# Patient Record
Sex: Female | Born: 1945 | Race: White | Hispanic: No | Marital: Married | Smoking: Never smoker
Health system: Western US, Academic
[De-identification: ages and names within clinical notes are randomized; demographics above are authoritative.]

## PROBLEM LIST (undated history)

## (undated) ENCOUNTER — Ambulatory Visit: Payer: Self-pay | Admitting: Orthopaedic Surgery

## (undated) ENCOUNTER — Encounter: Admission: RE | Payer: Self-pay

## (undated) ENCOUNTER — Ambulatory Visit: Payer: Self-pay | Admitting: Interventional Radiology

## (undated) ENCOUNTER — Ambulatory Visit: Admission: RE | Admitting: Ophthalmology

## (undated) ENCOUNTER — Ambulatory Visit: Admission: RE | Payer: Medicare Other | Admitting: Ophthalmology

## (undated) DIAGNOSIS — M069 Rheumatoid arthritis, unspecified: Secondary | ICD-10-CM

## (undated) DIAGNOSIS — M86659 Other chronic osteomyelitis, unspecified thigh: Secondary | ICD-10-CM

## (undated) DIAGNOSIS — E039 Hypothyroidism, unspecified: Secondary | ICD-10-CM

## (undated) DIAGNOSIS — H269 Unspecified cataract: Secondary | ICD-10-CM

## (undated) DIAGNOSIS — H04129 Dry eye syndrome of unspecified lacrimal gland: Secondary | ICD-10-CM

## (undated) HISTORY — DX: Other chronic osteomyelitis, unspecified thigh (CMS-HCC): M86.659

## (undated) HISTORY — DX: Unspecified cataract: H26.9

## (undated) HISTORY — DX: Hypothyroidism, unspecified: E03.9

## (undated) HISTORY — DX: Rheumatoid arthritis, unspecified (CMS-HCC): M06.9

## (undated) HISTORY — DX: Dry eye syndrome of unspecified lacrimal gland: H04.129

## (undated) SURGERY — EXTRACTION, CATARACT, EXTRACAPSULAR, WITH IOL INSERTION
Anesthesia: Monitored Anesthesia Care (MAC) | Site: Eye | Laterality: Left

## (undated) SURGERY — PHACOEMULSIFICATION, CATARACT
Anesthesia: Monitored Anesthesia Care (MAC) | Site: Eye | Laterality: Right

## (undated) SURGERY — EXCISION, MASS, HAND
Anesthesia: Local | Laterality: Right

## (undated) MED ORDER — PHENYLEPHRINE HCL 2.5 % OP SOLN
1.0000 [drp] | OPHTHALMIC | Status: AC
Start: 2020-08-03 — End: 2020-08-03

## (undated) MED ORDER — PHENYLEPHRINE HCL 2.5 % OP SOLN
1.0000 [drp] | OPHTHALMIC | Status: AC
Start: 2019-08-15 — End: 2019-08-15

## (undated) MED ORDER — MOXIFLOXACIN HCL 0.5 % OP SOLN
1.0000 [drp] | Freq: Four times a day (QID) | OPHTHALMIC | 0 refills | Status: AC
Start: 2020-08-02 — End: ?

## (undated) MED ORDER — PREDNISOLONE ACETATE 1 % OP SUSP
1.0000 [drp] | Freq: Once | OPHTHALMIC | Status: AC
Start: 2019-08-15 — End: 2019-08-15

## (undated) MED ORDER — TOBRAMYCIN SULFATE 0.3 % OP SOLN
1.0000 [drp] | OPHTHALMIC | Status: AC
Start: 2019-08-15 — End: 2019-08-15

## (undated) MED ORDER — METHOCARBAMOL 750 MG OR TABS
750.0000 mg | ORAL_TABLET | Freq: Four times a day (QID) | ORAL | 0 refills | Status: AC
Start: 2023-01-14 — End: ?

## (undated) MED ORDER — TETRACAINE HCL 0.5% VISCOUS OP SOLN (~~LOC~~)
1.0000 [drp] | Freq: Once | OPHTHALMIC | Status: AC
Start: 2019-08-15 — End: 2019-08-15

## (undated) MED ORDER — TOBRAMYCIN SULFATE 0.3 % OP SOLN
1.0000 [drp] | OPHTHALMIC | Status: AC
Start: 2020-08-03 — End: 2020-08-03

## (undated) MED ORDER — OXYCODONE HCL 5 MG OR CAPS
5.0000 mg | ORAL_CAPSULE | Freq: Four times a day (QID) | ORAL | 0 refills | Status: AC | PRN
Start: 2022-03-17 — End: ?

## (undated) MED ORDER — PREDNISOLONE ACETATE 1 % OP SUSP
1.0000 [drp] | Freq: Once | OPHTHALMIC | Status: AC
Start: 2020-08-03 — End: 2020-08-03

## (undated) MED ORDER — IPRATROPIUM-ALBUTEROL 0.5-2.5 (3) MG/3ML IN SOLN
3.0000 mL | Freq: Once | RESPIRATORY_TRACT | Status: AC
Start: 2022-03-20 — End: 2022-03-21

## (undated) MED ORDER — BROMFENAC SODIUM 0.07 % OP SOLN
1.0000 [drp] | Freq: Two times a day (BID) | OPHTHALMIC | 0 refills | Status: AC
Start: 2020-08-02 — End: ?

## (undated) MED ORDER — TETRACAINE HCL 0.5 % OP SOLN
1.0000 [drp] | OPHTHALMIC | Status: AC
Start: 2019-08-15 — End: 2019-08-15

## (undated) MED ORDER — TETRACAINE HCL 0.5 % OP SOLN
1.0000 [drp] | OPHTHALMIC | Status: AC
Start: 2020-08-03 — End: 2020-08-03

## (undated) MED ORDER — METHOCARBAMOL 750 MG OR TABS
750.0000 mg | ORAL_TABLET | Freq: Four times a day (QID) | ORAL | 0 refills | Status: AC
Start: 2022-10-19 — End: ?

## (undated) MED ORDER — TROPICAMIDE 1 % OP SOLN
1.0000 [drp] | OPHTHALMIC | Status: AC
Start: 2020-08-03 — End: 2020-08-03

## (undated) MED ORDER — TROPICAMIDE 1 % OP SOLN
1.0000 [drp] | OPHTHALMIC | Status: AC
Start: 2019-08-15 — End: 2019-08-15

## (undated) MED ORDER — TETRACAINE HCL 0.5 % OP SOLN
1.00 [drp] | OPHTHALMIC | Status: AC
Start: 2019-08-15 — End: 2019-08-15

## (undated) MED ORDER — KETOROLAC TROMETHAMINE 0.5 % OP SOLN
1.0000 [drp] | Freq: Four times a day (QID) | OPHTHALMIC | 0 refills | Status: AC
Start: 2020-08-02 — End: ?

## (undated) MED ORDER — OFLOXACIN 0.3 % OP SOLN
1.0000 [drp] | Freq: Four times a day (QID) | OPHTHALMIC | 0 refills | Status: AC
Start: 2020-08-02 — End: ?

## (undated) MED ORDER — METHYLPREDNISOLONE 4 MG OR TABS
4.0000 mg | ORAL_TABLET | Freq: Every day | ORAL | 0 refills | Status: AC
Start: 2022-04-03 — End: 2022-04-08

## (undated) MED ORDER — TROPICAMIDE 1 % OP SOLN
1.00 [drp] | OPHTHALMIC | Status: AC
Start: 2019-08-15 — End: 2019-08-15

## (undated) MED ORDER — TETRACAINE HCL 0.5% VISCOUS OP SOLN (~~LOC~~)
1.00 [drp] | Freq: Once | OPHTHALMIC | Status: AC
Start: 2019-08-15 — End: 2019-08-15

## (undated) MED ORDER — TOBRAMYCIN SULFATE 0.3 % OP SOLN
1.00 [drp] | OPHTHALMIC | Status: AC
Start: 2019-08-15 — End: 2019-08-15

## (undated) MED ORDER — PHENYLEPHRINE HCL 2.5 % OP SOLN
1.00 [drp] | OPHTHALMIC | Status: AC
Start: 2020-08-03 — End: 2020-08-03

## (undated) MED ORDER — TROPICAMIDE 1 % OP SOLN
1.00 [drp] | OPHTHALMIC | Status: AC
Start: 2020-08-03 — End: 2020-08-03

## (undated) MED ORDER — PREDNISOLONE ACETATE 1 % OP SUSP
1.00 [drp] | Freq: Once | OPHTHALMIC | Status: AC
Start: 2020-08-03 — End: 2020-08-03

## (undated) MED ORDER — PREDNISOLONE ACETATE 1 % OP SUSP
1.00 [drp] | Freq: Once | OPHTHALMIC | Status: AC
Start: 2019-08-15 — End: 2019-08-15

## (undated) MED ORDER — TETRACAINE HCL 0.5 % OP SOLN
1.00 [drp] | OPHTHALMIC | Status: AC
Start: 2020-08-03 — End: 2020-08-03

## (undated) MED ORDER — TOBRAMYCIN SULFATE 0.3 % OP SOLN
1.00 [drp] | OPHTHALMIC | Status: AC
Start: 2020-08-03 — End: 2020-08-03

## (undated) MED ORDER — BROMFENAC SODIUM 0.07 % OP SOLN
1.00 [drp] | Freq: Two times a day (BID) | OPHTHALMIC | 0 refills | Status: AC
Start: 2020-08-02 — End: ?

## (undated) MED ORDER — OFLOXACIN 0.3 % OP SOLN
1.00 [drp] | Freq: Four times a day (QID) | OPHTHALMIC | 0 refills | Status: AC
Start: 2020-08-02 — End: ?

## (undated) MED ORDER — PHENYLEPHRINE HCL 2.5 % OP SOLN
1.00 [drp] | OPHTHALMIC | Status: AC
Start: 2019-08-15 — End: 2019-08-15

## (undated) MED ORDER — MOXIFLOXACIN HCL 0.5 % OP SOLN
1.00 [drp] | Freq: Four times a day (QID) | OPHTHALMIC | 0 refills | Status: AC
Start: 2020-08-02 — End: ?

## (undated) MED ORDER — KETOROLAC TROMETHAMINE 0.5 % OP SOLN
1.00 [drp] | Freq: Four times a day (QID) | OPHTHALMIC | 0 refills | Status: AC
Start: 2020-08-02 — End: ?

---

## 2017-09-14 HISTORY — PX: BONE RESECTION: SHX897

## 2019-08-15 ENCOUNTER — Ambulatory Visit: Attending: Optometrist | Admitting: Ophthalmology

## 2019-08-15 ENCOUNTER — Encounter: Payer: Self-pay | Admitting: Ophthalmology

## 2019-08-15 ENCOUNTER — Ambulatory Visit: Payer: Medicare Other | Attending: Optometrist | Admitting: Ophthalmology

## 2019-08-15 DIAGNOSIS — H02889 Meibomian gland dysfunction of unspecified eye, unspecified eyelid: Secondary | ICD-10-CM | POA: Insufficient documentation

## 2019-08-15 DIAGNOSIS — M3501 Sicca syndrome with keratoconjunctivitis: Secondary | ICD-10-CM | POA: Insufficient documentation

## 2019-08-15 DIAGNOSIS — H269 Unspecified cataract: Secondary | ICD-10-CM | POA: Insufficient documentation

## 2019-08-15 DIAGNOSIS — H2513 Age-related nuclear cataract, bilateral: Secondary | ICD-10-CM

## 2019-08-15 MED ORDER — METHOTREXATE SODIUM 250 MG/10ML IJ SOLN
0.8000 mL | INTRAMUSCULAR | Status: DC
Start: 2019-07-13 — End: 2020-07-23

## 2019-08-15 MED ORDER — AZELASTINE HCL 137 MCG/SPRAY NA SOLN
1.0000 | NASAL | Status: DC
Start: ? — End: 2020-07-23

## 2019-08-15 MED ORDER — TOBRAMYCIN SULFATE 0.3 % OP SOLN
1.0000 [drp] | Freq: Four times a day (QID) | OPHTHALMIC | 0 refills | Status: DC
Start: 2019-08-15 — End: 2022-03-19

## 2019-08-15 MED ORDER — THERATEARS 0.25 % OP SOLN: 1.0000 [drp] | Freq: Every day | OPHTHALMIC | Status: AC

## 2019-08-15 MED ORDER — FLUOROMETHOLONE 0.1 % OP SUSP
1.0000 [drp] | Freq: Four times a day (QID) | OPHTHALMIC | 0 refills | Status: DC
Start: 2019-08-15 — End: 2019-08-17

## 2019-08-15 MED ORDER — OLOPATADINE HCL 0.7 % OP SOLN
1.0000 [drp] | Freq: Every day | OPHTHALMIC | Status: DC
Start: 2019-05-08 — End: 2022-03-19

## 2019-08-15 MED ORDER — FLUTICASONE PROPIONATE 50 MCG/ACT NA SUSP
1.0000 | NASAL | Status: DC
Start: ? — End: 2022-03-19

## 2019-08-15 MED ORDER — SYNTHROID 88 MCG OR TABS
1.0000 | ORAL_TABLET | Freq: Every day | ORAL | Status: DC
Start: 2019-06-27 — End: 2022-03-19

## 2019-08-15 MED ORDER — FOLIC ACID 1 MG OR TABS
1.0000 mg | ORAL_TABLET | ORAL | Status: AC
Start: 2019-05-10 — End: ?

## 2019-08-15 MED ORDER — SERTRALINE HCL 100 MG OR TABS
150.0000 mg | ORAL_TABLET | Freq: Every day | ORAL | Status: DC
Start: 2019-06-12 — End: 2022-03-19

## 2019-08-15 MED ORDER — OLOPATADINE HCL 0.7 % OP SOLN
1.00 [drp] | Freq: Every day | OPHTHALMIC | Status: AC
Start: 2019-05-08 — End: ?

## 2019-08-15 MED ORDER — FLUOROMETHOLONE 0.1 % OP SUSP
1.00 [drp] | Freq: Four times a day (QID) | OPHTHALMIC | 0 refills | Status: DC
Start: 2019-08-15 — End: 2019-08-17

## 2019-08-15 MED ORDER — THERATEARS 0.25 % OP SOLN: 1.00 [drp] | Freq: Every day | OPHTHALMIC | Status: AC

## 2019-08-15 MED ORDER — METHOTREXATE SODIUM 250 MG/10ML IJ SOLN
0.80 mL | INTRAMUSCULAR | Status: DC
Start: 2019-07-13 — End: 2020-07-23

## 2019-08-15 MED ORDER — SERTRALINE HCL 100 MG OR TABS
150.00 mg | ORAL_TABLET | Freq: Every day | ORAL | Status: AC
Start: 2019-06-12 — End: ?

## 2019-08-15 MED ORDER — SYNTHROID 88 MCG OR TABS
1.00 | ORAL_TABLET | Freq: Every day | ORAL | Status: AC
Start: 2019-06-27 — End: ?

## 2019-08-15 MED ORDER — FOLIC ACID 1 MG OR TABS
1.00 mg | ORAL_TABLET | ORAL | Status: AC
Start: 2019-05-10 — End: ?

## 2019-08-15 MED ORDER — FLUTICASONE PROPIONATE 50 MCG/ACT NA SUSP: 1.00 | NASAL | Status: AC

## 2019-08-15 MED ORDER — AZELASTINE HCL 137 MCG/SPRAY NA SOLN
1.00 | NASAL | Status: DC
Start: ? — End: 2020-07-23

## 2019-08-15 MED ORDER — TOBRAMYCIN SULFATE 0.3 % OP SOLN
1.00 [drp] | Freq: Four times a day (QID) | OPHTHALMIC | 0 refills | Status: AC
Start: 2019-08-15 — End: ?

## 2019-08-15 NOTE — Progress Notes (Signed)
New patient referred by Dr. Ranae Pila   First seen 08/15/2019     Nuclear sclerosis OU   Borderline visually significant cataracts OU--> also with significant dry eyes, discussed that combination of both things are effecting the vision     Visually bothered by: glare night driving, difficulty with near vision, does needle point and is having trouble     Visual goal: distance and wear OTC reading glasses     Factors:   History of hyperopic LASIK OU  Dry eyes in setting of RA     Plan:   Optimize surface and recheck measurements, likely plan is monofocal distance with catalys OD, monofocal distance +/- catalys OS   + ORA OU given history of LASIK     **multiple allergies will likely need to send alternative drops (tobramycin and hold acular, PF ok )    08/15/2019     IOLm TK   OD: 0.86 @ 178  OS: 0.36 @ 133    IOLm K   OD: 0.56 @ 180  OS: 0.60 @ 120    topo  OD:0.79 @ 008  OS: 0.33 @ 025     TCRP:   OD: 1.0 @ 008  OS: 0.4 @ 178    Dry eye OU   OD>OS  Was given Shirley Friar but finds that it burns a lot and is hard to tolerate  Would restart Xiidra BID  Also start FML taper 4-2-1- will RX   PFAT Q 1-2 hours   WC/LH     RTC 2-3 weeks for repeat measurements and repeat glare testing. sooner PRN     Incident to Additional Comments by Signing Physician  I reviewed and confirmed all history components, including the HPI; and any other elements performed by the technician.     Adriana Reams MD  Associate Professor of Ophthalmology  Cornea, Cataract, and Refractive Surgery

## 2019-08-16 ENCOUNTER — Telehealth: Payer: Self-pay | Admitting: Ophthalmology

## 2019-08-16 NOTE — Telephone Encounter (Signed)
FML is backorder. Please send an alternative. Per pharmacy, Prednisolone 1! Is available.

## 2019-08-16 NOTE — Telephone Encounter (Signed)
Per patient she still can not pick up her medications please assist ASAP   She need medications before her surgery

## 2019-08-17 ENCOUNTER — Telehealth: Payer: Self-pay | Admitting: Ophthalmology

## 2019-08-17 MED ORDER — FLUOROMETHOLONE 0.1 % OP SUSP
1.0000 [drp] | Freq: Four times a day (QID) | OPHTHALMIC | 0 refills | Status: AC
Start: 2019-08-17 — End: ?

## 2019-08-17 MED ORDER — FLUOROMETHOLONE 0.1 % OP SUSP
1.00 [drp] | Freq: Four times a day (QID) | OPHTHALMIC | 0 refills | Status: AC
Start: 2019-08-17 — End: ?

## 2019-08-17 NOTE — Telephone Encounter (Signed)
Pt.states she still waiting for the steroid drops and she wants to confirm which eye drops she needs to use before surgery. Told her that per pharmacy, [facility] is on backorder. Pt.requested to re-send the FML or the alternative to [facility] that is on file.   Also pt.needs the pre-op forms sent to her PCP Dr. Reather Laurence, address [address], Fax [phone]

## 2019-08-17 NOTE — Telephone Encounter (Signed)
Shelly Neal with Doctor Val[facility] office states have questions regarding future surgery, Shelly Neal forwarded to surgery scheduler Briget to assist. Thank you.

## 2019-08-17 NOTE — Telephone Encounter (Signed)
Tori Milks, MD  You 4 minutes ago (9:17 AM)     Medication sent to pharmacy listed      Called pt.to let her know that the medication was sent.

## 2019-08-21 ENCOUNTER — Telehealth: Payer: Self-pay | Admitting: Ophthalmology

## 2019-08-21 NOTE — Telephone Encounter (Signed)
Patient was connected to [facility] for her pre op forms

## 2019-08-24 ENCOUNTER — Telehealth: Payer: Self-pay | Admitting: Ophthalmology

## 2019-08-24 NOTE — Telephone Encounter (Signed)
Rine from patient doctor office called and request to get another copy of H & P short form, medical clearance that did not have EKG and lab work required fax over. Patient is having Cataract surgery.

## 2019-08-24 NOTE — Telephone Encounter (Signed)
In need to know if her arthritis medication will affect surgery

## 2019-08-24 NOTE — Telephone Encounter (Signed)
srina called from dr. Reather Laurence office they have new protocol for the office. Would like to speak to surgery scheduler  Please call her back [phone] option 2. Thanks!

## 2019-08-24 NOTE — Telephone Encounter (Signed)
Patient is requesting pre op lab order to be faxed to [place] [facility]  [address] [place], [place] [address]  Phone [phone]  Fax?

## 2019-08-25 ENCOUNTER — Telehealth: Payer: Self-pay | Admitting: Ophthalmology

## 2019-08-25 NOTE — Telephone Encounter (Signed)
Rine(MA)  called to request another H & P form from Dr. Reather Laurence  office.     Different number   Office: [phone]   Fax: [phone]

## 2019-08-28 ENCOUNTER — Other Ambulatory Visit: Payer: Self-pay

## 2019-08-28 MED ORDER — TOBRAMYCIN SULFATE 0.3 % OP SOLN
OPHTHALMIC | 0 refills | Status: DC
Start: 2019-08-28 — End: 2022-03-19
  Filled 2019-08-28: qty 5, 1d supply, fill #0

## 2019-08-28 MED ORDER — PREDNISOLONE ACETATE 1 % OP SUSP
OPHTHALMIC | 0 refills | Status: DC
Start: 2019-08-28 — End: 2020-10-18
  Filled 2019-08-28: qty 10, 1d supply, fill #0

## 2019-08-28 MED ORDER — ANTERIOR BLUE EYE KIT (WAM) (~~LOC~~)
PACK | 0 refills | Status: AC
Start: 2019-08-28 — End: ?
  Filled 2019-08-28: qty 1, 1d supply, fill #0

## 2019-08-28 MED ORDER — PREDNISOLONE ACETATE 1 % OP SUSP
OPHTHALMIC | 0 refills | Status: DC
Start: 2019-08-28 — End: 2020-10-18

## 2019-08-28 MED ORDER — ANTERIOR BLUE EYE KIT (WAM) (~~LOC~~)
PACK | 0 refills | Status: AC
Start: 2019-08-28 — End: ?

## 2019-08-28 MED ORDER — TOBRAMYCIN SULFATE 0.3 % OP SOLN
OPHTHALMIC | 0 refills | Status: AC
Start: 2019-08-28 — End: ?

## 2019-08-28 NOTE — Anesthesia Preprocedure Evaluation (Signed)
ANESTHESIA PRE-OPERATIVE EVALUATION    Patient Information    Name: Shelly Neal    MRN: [id number]    DOB: Dec 10, 1945    Age: 73 year old    Sex: female  Procedure(s):  P3 CATARACT EXTRACTION WITH INTRAOCULAR LENS IMPLANT RIGHT EYE      Pre-op Vitals:   There were no vitals taken for this visit.        Primary language spoken:  English    ROS/Medical History:               General:  Exercise YES-Walking daily 1 mile  Cardiovascular:     Anesthesia History:  no history of anesthetic complications,  no family history of anesthetic complications,   Pulmonary:   asthma,  sleep apnea (Snoring ),     Neuro/Psych:    Hematology/Oncology:       GI/Hepatic:   Infectious Disease:     Renal:   Endocrine/Other:  steriod use (Yes-Hypothyroid ),  arthritis (Rheumatoid ),      Pregnancy History:   Pediatrics:         Pre Anesthesia Testing (PCC/CPC) notes/comments:    [facility] Test & records reviewed by [facility] Provider.                                                   Past Medical History:   Diagnosis Date   . Cataract    . Dry eye    . Hypothyroidism    . RA (rheumatoid arthritis) (CMS-HCC)      No past surgical history on file.  Social History     Tobacco Use   . Smoking status: Not on file   Substance Use Topics   . Alcohol use: Not on file   . Drug use: Not on file       No current facility-administered medications for this encounter.      Current Outpatient Medications   Medication Sig Dispense Refill   . azelastine (ASTELIN) 0.1 % nasal spray 1 spray by Nasal route.     . Carboxymethylcellulose Sodium (THERATEARS) 0.25 % SOLN Place 1 drop into both eyes daily.     . fluorometholone (FML) 0.1 % ophthalmic suspension Place 1 drop into both eyes 4 times daily. 1 bottle 0   . fluticasone propionate (FLONASE) 50 MCG/ACT nasal spray 1 spray by Nasal route.     . folic acid (FOLVITE) 1 MG tablet 1 mg by Oral route.     . methotrexate (RHEUMATREX) 250 MG/10ML SOLN injection 0.8 mL once a week.     . Olopatadine HCl 0.7 % SOLN Place 1 drop  into both eyes daily.     . sertraline (ZOLOFT) 100 MG tablet Take 150 mg by mouth daily.     Marland Kitchen SYNTHROID 88 MCG tablet Take 1 tablet by mouth daily.     Marland Kitchen tobramycin (TOBREX) 0.3 % ophthalmic solution Place 1 drop into right eye 4 times daily. Start 3 days prior to surgery 1 bottle 0     Allergies   Allergen Reactions   . Cefaclor Hives     Other reaction(s): Pruritic rash (disorder)  Other reaction(s): Pruritic rash (disorder)  No problems with amoxicillin/clavulanate - last taken October 2019  Other reaction(s): Pruritic rash (disorder)  Other reaction(s): Pruritic rash (disorder)  Other reaction(s): Pruritic rash (disorder)     .  Ciprofloxacin Rash   . Hydromorphone Rash     pruritic rash to left arm - no shortness of breath  pruritic rash to left arm - no shortness of breath  pruritic rash to left arm - no shortness of breath  pruritic rash to left arm - no shortness of breath  pruritic rash to left arm - no shortness of breath     . Ibuprofen Hives and Rash     Aleve     . Meloxicam Rash   . Naproxen Rash   . Polyethylene Glycol Hives     Other reaction(s): Pruritic rash (disorder)  Other reaction(s): Pruritic rash (disorder)  Other reaction(s): Pruritic rash (disorder)  Other reaction(s): Pruritic rash (disorder)     . Sulfamethoxazole W-Trimethoprim Hives     Other reaction(s): Pruritic rash (disorder)  Other reaction(s): Pruritic rash (disorder)     . Gabapentin Nausea and Vomiting       Labs and Other Data  No results found for: NA, SODIUM, K, CL, BICARB, BUN, CREAT, GLU, Baxter  No results found for: AST, ALT, GGT, LDH, ALK, TP, ALB, TBILI, DBILI  No results found for: WBC, RBC, HGB, HCT, MCV, MCHC, RDW, PLT, PLCTEL, MPV, MPVH, SEG, LYMPHS, MONOS, EOS, BASOS  No results found for: INR, PTT  No results found for: ARTPH, ARTPO2, ARTPCO2    Anesthesia Plan

## 2019-08-29 ENCOUNTER — Ambulatory Visit: Attending: Optometrist | Admitting: Ophthalmology

## 2019-08-29 ENCOUNTER — Encounter: Payer: Self-pay | Admitting: Ophthalmology

## 2019-08-29 ENCOUNTER — Ambulatory Visit: Payer: Medicare Other | Attending: Optometrist | Admitting: Ophthalmology

## 2019-08-29 DIAGNOSIS — M3501 Sicca syndrome with keratoconjunctivitis: Secondary | ICD-10-CM | POA: Insufficient documentation

## 2019-08-29 DIAGNOSIS — H02889 Meibomian gland dysfunction of unspecified eye, unspecified eyelid: Secondary | ICD-10-CM

## 2019-08-29 DIAGNOSIS — H2513 Age-related nuclear cataract, bilateral: Secondary | ICD-10-CM | POA: Insufficient documentation

## 2019-08-29 NOTE — Progress Notes (Signed)
New patient referred by Dr. Ranae Pila   First seen 08/15/2019     Dr. 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 RA     Plan:   Optimize surface and recheck measurements, likely plan is monofocal distance with catalys OD, monofocal distance +/- catalys OS   + ORA OU given history of LASIK     **multiple allergies will likely need to send alternative drops (tobramycin and hold acular, PF ok )    08/15/2019     IOLm TK   OD: 0.86 @ 178  OS: 0.36 @ 133    IOLm K   OD: 0.56 @ 180  OS: 0.60 @ 120    topo  OD:0.79 @ 008  OS: 0.33 @ 025     TCRP:   OD: 1.0 @ 008  OS: 0.4 @ 178    08/29/2019 : here for repeat measurements  IOLm TK   OD: 0.71 @ 176  OS: 0.30@ 028    IOLm K   OD: 0.43 @ 180  OS: 0.19 @ 074    topo  OD: 0.85 @ 009  OS: 0.43 @ 006     TCRP:   OD: 1.2 @ 008  OS: 0.6 @ 179     Dry eye OU   OD>OS  Was given Shirley Friar but finds that it burns a lot and is hard to tolerate  Would restart Xiidra BID  On FML QID--> IOP increased, did not taper, taper BID x 4 days, then Q day x4 days then stop   PFAT Q 1-2 hours   WC/LH     Inflammation systemically is very high from RA right now--> would like patient to see Rhuem and get inflammation controlled x 3 months prior to surgery, RTC 2 months to reassess     Incident to Additional Comments by Signing Physician  I reviewed and confirmed all history components, including the HPI; and any other elements performed by the technician.     Adriana Reams MD  Associate Professor of Ophthalmology  Cornea, Cataract, and Refractive Surgery

## 2019-09-01 ENCOUNTER — Ambulatory Visit

## 2019-09-01 ENCOUNTER — Ambulatory Visit: Payer: Medicare Other

## 2019-09-04 DIAGNOSIS — H2513 Age-related nuclear cataract, bilateral: Secondary | ICD-10-CM

## 2019-09-05 ENCOUNTER — Ambulatory Visit: Admitting: Ophthalmology

## 2019-09-05 ENCOUNTER — Ambulatory Visit: Payer: Medicare Other | Admitting: Ophthalmology

## 2019-09-17 ENCOUNTER — Ambulatory Visit

## 2019-09-17 ENCOUNTER — Ambulatory Visit: Payer: Medicare Other

## 2019-09-18 ENCOUNTER — Other Ambulatory Visit: Payer: Self-pay

## 2019-09-18 DIAGNOSIS — H2513 Age-related nuclear cataract, bilateral: Secondary | ICD-10-CM | POA: Insufficient documentation

## 2019-09-19 ENCOUNTER — Ambulatory Visit: Admitting: Ophthalmology

## 2019-09-19 ENCOUNTER — Ambulatory Visit: Payer: Medicare Other | Admitting: Ophthalmology

## 2019-09-19 NOTE — Progress Notes (Deleted)
New patient referred by Dr. Ranae Pila   First seen 08/15/2019     Dr. 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     Plan:   Optimize surface and recheck measurements, likely plan is monofocal distance with catalys OD, monofocal distance +/- catalys OS   + ORA OU given history of LASIK     **multiple allergies will likely need to send alternative drops (tobramycin and hold acular, PF ok )    08/15/2019     IOLm TK   OD: 0.86 @ 178  OS: 0.36 @ 133    IOLm K   OD: 0.56 @ 180  OS: 0.60 @ 120    topo  OD:0.79 @ 008  OS: 0.33 @ 025     TCRP:   OD: 1.0 @ 008  OS: 0.4 @ 178    08/29/2019 : here for repeat measurements  IOLm TK   OD: 0.71 @ 176  OS: 0.30@ 028    IOLm K   OD: 0.43 @ 180  OS: 0.19 @ 074    topo  OD: 0.85 @ 009  OS: 0.43 @ 006     TCRP:   OD: 1.2 @ 008  OS: 0.6 @ 179     09/19/2019: returning today to discuss CE/IOL again  Systemic inflammation under better control? ***    Dry eye OU   OD>OS  Was given Shirley Friar but finds that it burns a lot and is hard to tolerate  Would restart Xiidra BID  On FML QID--> IOP increased, did not taper, taper BID x 4 days, then Q day x4 days then stop   PFAT Q 1-2 hours   WC/LH     Inflammation systemically is very high from RA right now--> would like patient to see Rhuem and get inflammation controlled x 3 months prior to surgery, RTC 2 months to reassess

## 2019-10-09 ENCOUNTER — Ambulatory Visit: Payer: Self-pay

## 2019-10-12 ENCOUNTER — Ambulatory Visit: Payer: Self-pay

## 2019-11-10 ENCOUNTER — Ambulatory Visit: Admitting: Ophthalmology

## 2019-11-10 ENCOUNTER — Ambulatory Visit: Payer: Medicare Other | Admitting: Ophthalmology

## 2020-07-10 ENCOUNTER — Telehealth: Payer: Self-pay | Admitting: Ophthalmology

## 2020-07-10 NOTE — Telephone Encounter (Signed)
SCHEDULED

## 2020-07-10 NOTE — Telephone Encounter (Signed)
The pt needs to be seen sooner with Dr Alveta Heimlich, please follow up and assist the pt. Very urgent per pt to be seen sooner, per pt her cataract are worsening   thank you.

## 2020-07-23 ENCOUNTER — Encounter: Payer: Self-pay | Admitting: Orthopaedic Surgery

## 2020-07-23 ENCOUNTER — Ambulatory Visit: Attending: Orthopaedic Surgery | Admitting: Orthopaedic Surgery

## 2020-07-23 ENCOUNTER — Ambulatory Visit: Payer: Medicare Other | Attending: Orthopaedic Surgery | Admitting: Orthopaedic Surgery

## 2020-07-23 VITALS — BP 121/76 | HR 64 | Temp 96.8°F | Resp 17

## 2020-07-23 DIAGNOSIS — R2231 Localized swelling, mass and lump, right upper limb: Secondary | ICD-10-CM | POA: Insufficient documentation

## 2020-07-23 MED ORDER — FLUTICASONE-SALMETEROL 250-50 MCG/DOSE IN MISC
INHALATION_SPRAY | RESPIRATORY_TRACT | Status: DC
Start: 2019-10-24 — End: 2022-03-21

## 2020-07-23 MED ORDER — PREGABALIN 25 MG OR CAPS
ORAL_CAPSULE | ORAL | Status: AC
Start: 2020-06-12 — End: ?

## 2020-07-23 MED ORDER — TIOTROPIUM BROMIDE MONOHYDRATE 18 MCG IN CAPS
18.0000 ug | ORAL_CAPSULE | RESPIRATORY_TRACT | Status: DC
Start: ? — End: 2020-09-02

## 2020-07-23 MED ORDER — BUDESONIDE-FORMOTEROL FUMARATE 160-4.5 MCG/ACT IN AERO: 2.0000 | INHALATION_SPRAY | RESPIRATORY_TRACT | Status: AC | PRN

## 2020-07-23 MED ORDER — PREDNISONE 20 MG OR TABS
ORAL_TABLET | ORAL | Status: DC
Start: ? — End: 2020-09-02

## 2020-07-23 MED ORDER — AZELASTINE HCL 0.15 % NA SOLN
NASAL | Status: DC
Start: ? — End: 2022-03-19

## 2020-07-23 MED ORDER — TRAMADOL HCL 50 MG OR TABS
50.0000 mg | ORAL_TABLET | ORAL | Status: DC
Start: 2020-03-26 — End: 2020-09-02

## 2020-07-23 MED ORDER — XIIDRA 5 % OP SOLN
OPHTHALMIC | Status: DC
Start: 2020-07-01 — End: 2022-03-19

## 2020-07-23 MED ORDER — METHOTREXATE 2.5 MG OR TABS
ORAL_TABLET | ORAL | Status: DC
Start: 2019-05-30 — End: 2022-03-19

## 2020-07-23 MED ORDER — OYSTER SHELL CALCIUM 500 MG OR TABS
ORAL_TABLET | ORAL | Status: DC
Start: ? — End: 2022-03-21

## 2020-07-23 MED ORDER — TRAMADOL HCL 50 MG OR TABS
50.00 mg | ORAL_TABLET | ORAL | Status: DC
Start: 2020-03-26 — End: 2020-09-02

## 2020-07-23 MED ORDER — METHOTREXATE 2.5 MG OR TABS
ORAL_TABLET | ORAL | Status: AC
Start: 2019-05-30 — End: ?

## 2020-07-23 MED ORDER — PREGABALIN 25 MG OR CAPS
ORAL_CAPSULE | ORAL | Status: DC
Start: 2020-06-12 — End: 2020-09-02

## 2020-07-23 MED ORDER — FLUTICASONE-SALMETEROL 250-50 MCG/DOSE IN MISC
INHALATION_SPRAY | RESPIRATORY_TRACT | Status: AC
Start: 2019-10-24 — End: ?

## 2020-07-23 MED ORDER — AZELASTINE HCL 0.15 % NA SOLN: NASAL | Status: AC

## 2020-07-23 MED ORDER — TIOTROPIUM BROMIDE MONOHYDRATE 18 MCG IN CAPS
18.00 ug | ORAL_CAPSULE | RESPIRATORY_TRACT | Status: DC
Start: ? — End: 2020-09-02

## 2020-07-23 MED ORDER — XIIDRA 5 % OP SOLN
OPHTHALMIC | Status: AC
Start: 2020-07-01 — End: ?

## 2020-07-23 MED ORDER — BUDESONIDE-FORMOTEROL FUMARATE 160-4.5 MCG/ACT IN AERO: 2.00 | INHALATION_SPRAY | RESPIRATORY_TRACT | Status: AC

## 2020-07-23 MED ORDER — OYSTER SHELL CALCIUM 500 MG OR TABS: ORAL_TABLET | ORAL | Status: AC

## 2020-07-23 NOTE — Progress Notes (Signed)
Maurilio Lovely,  MD / [facility]  Assistant Professor [place], [place], and Shoulder Surgery [address]   Department of Orthopaedic Surgery [place], [place] [address]  [phone]  License # [id number]     [facility]   [address], [place]   [place], [place] [address]  [phone]             Initial Office Visit    DATE OF VISIT: 07/23/2020  PATIENT NAME: Shelly Neal  DOB: 03-31-1946    REFERRED BY: Johnanna Schneiders  PRIMARY CARE PHYSICIAN: Johnanna Schneiders    CHIEF COMPLAINT:  Right thumb mass    HISTORY OF PRESENT ILLNESS:  Shelly Neal is a 74 year old female right hand dominant who presents with right thumb mass. Patient complains of a small mass on the ulnar aspect of her hand for the past 2 months. She feels that mass is stable in size without any changes. She denies any inciting trauma and the mass appeared and has since then stayed the same. She denies any pain or discomfort. She denies any numbness/tingling sensation. She denies any fevers or night pain. She denies any radiating pain with palpation. 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     PAST MEDICAL HISTORY:   Past Medical History:   Diagnosis Date   . Cataract    . Dry eye    . Hypothyroidism    . RA (rheumatoid arthritis) (CMS-HCC)        PAST SURGICAL HISTORY: 17 surgeries. Most recently osteomyelitis pelvis   No past surgical history on file.    MEDICATIONS:     Current Outpatient Medications:   .  anterior blue eye kit, USED AS INSTRUCTED BY YOUR PHYSICIAN (KIT FOR RIGHT EYE), Disp: 1 kit, Rfl: 0  .  azelastine (ASTELIN) 0.1 % nasal spray, 1 spray by Nasal route., Disp: , Rfl:   .  Carboxymethylcellulose Sodium (THERATEARS) 0.25 % SOLN, Place 1 drop into both eyes daily., Disp: , Rfl:   .  fluorometholone (FML) 0.1 % ophthalmic suspension, Place 1 drop into both eyes 4 times daily., Disp: 1 bottle, Rfl: 0  .  fluticasone propionate (FLONASE) 50 MCG/ACT  nasal spray, 1 spray by Nasal route., Disp: , Rfl:   .  folic acid (FOLVITE) 1 MG tablet, 1 mg by Oral route., Disp: , Rfl:   .  methotrexate (RHEUMATREX) 250 MG/10ML SOLN injection, 0.8 mL once a week., Disp: , Rfl:   .  Olopatadine HCl 0.7 % SOLN, Place 1 drop into both eyes daily., Disp: , Rfl:   .  prednisoLONE acetate (PRED FORTE) 1 % ophthalmic suspension, Place 1 drop into right eye 4 times daily., Disp: 10 mL, Rfl: 0  .  sertraline (ZOLOFT) 100 MG tablet, Take 150 mg by mouth daily., Disp: , Rfl:   .  SYNTHROID 88 MCG tablet, Take 1 tablet by mouth daily., Disp: , Rfl:   .  tobramycin (TOBREX) 0.3 % ophthalmic solution, Place 1 drop into right eye 4 times daily., Disp: 5 mL, Rfl: 0  .  tobramycin (TOBREX) 0.3 % ophthalmic solution, Place 1 drop into right eye 4 times daily. Start 3 days prior to surgery, Disp: 1 bottle, Rfl: 0    ALLERGIES:   Cefaclor, Ciprofloxacin, Diagnostic x-ray materials, Hydromorphone, Ibuprofen, Meloxicam, Naproxen, Polyethylene glycol, Sulfamethoxazole w-trimethoprim, and Gabapentin    FAMILY HISTORY:  Reviewed and negative  No family history on file.    SOCIAL HISTORY:  Tobacco: none  Alcohol: none  Drugs: none  Occupation: retired Pharmacist, hospital   Social History     Occupational History   . Not on file   Tobacco Use   . Smoking status: Never Smoker   . Smokeless tobacco: Never Used   Substance and Sexual Activity   . Alcohol use: Never   . Drug use: Never   . Sexual activity: Not on file        REVIEW OF SYSTEMS:  Patient denies fevers, chills, chest pain, shortness of breath, and lymphedema. A detailed 12-point review of systems was provided on the patient intake form and reviewed by MD. All other systems reviewed were negative.     PHYSICAL EXAMINATION:  Vital Signs: BP 121/76 (BP Location: Right arm, BP Patient Position: Sitting, BP cuff size: Regular)   Pulse 64   Temp 96.8 F (36 C)   Resp 17   General: The patient is in no acute distress and well nourished for her  age.  Psychiatric: The patient is awake, alert, and oriented to person, place, time and situation. Good mental health with normal mood and affect.  HEENT: Normocephalic, atraumatic. Normal hearing.  Cardiovascular: Palpable upper extremity pulses.  Respiratory: No labored breathing or intercostal retractions.   Skin:  No open lesions, open ulcers, or healing wounds. No induration.  Lymphatic System:  There is no lymphedema or pitting edema in the axilla or upper extremity.  Neurological: The patient has normal motor and sensory function in the distribution of the axillary, median, radial, and ulnar nerves. No long tract signs.  Musculoskeletal:     Right upper extremity  Cystic and mobile mass  on the volar radial aspect of right thumb IPJ   5 x 5 mm   Mobile   No tinels  Not tender to palpation  Skin intact  + thumbs up/ok /2-3 cross/ fist/opposition  Sensation intact to light touch: median, ulnar, radial nerve distribution  Fingers warm, well perfused  2+ radial pulse     IMAGING:   I personally reviewed the following imaging with the patient at today's office visit:    Ultrasound:  Ultrasound performed by MD.  A hypoechoic mass was identified on the radial aspect of the thumb IP joint.  This was subcutaneous.  Mobile.  There was no enhancement of color signal.  Cyst does not appear to communicate with joint. Images saved to epic    ASSESSMENT:  1. 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  We reviewed our clinical examination as well as the ultrasound with the patient.  She has a small, mobile,  hypoechoic mass.  This does appear benign in nature.  This is bothersome for her and she is interested in having this removed.  Discussed that we can perform this under local anesthesia.  We discussed that we do not have a definitive diagnosis for the mass until it is sent to the laboratory for pathologic examination.  She  understands and has questions whether this is correlated with other cysts she has had or her rheumatoid arthritis.  Again we discussed that we do not know until the final laboratory examination.  Patient understands and wishes to proceed.  - Plan for right thumb excision of mass under local anesthesia.    The diagnosis  and treatment options were discussed and reviewed in detail with the patient, who communicates understanding with the plan.    I have personally reviewed and interpreted the patient's prior diagnostic testing, relevant imaging, and external notes from their other treating physicians in the process of formulating my assessment and plan of care for their current symptoms and pathology.    I have also personally discussed the patient's diagnostic testing, relevant imaging, and my assessment and plan with the patient's relevant external physicians and healthcare providers as needed.    Recommended and prescribed: mass excision    Return to clinic postop    MEDICAL DECISION MAKING:    Problems: moderate  Data: moderate  Category 1: 3 data points   [facility] Provider notes reviewed and Provider ultrasound ordered and reviewed     Category 2: Independent interpretation of ultrasound  Category 3: I have also personally discussed the patient's diagnostic testing, relevant imaging, and my assessment and plan with the patient's relevant external physicians and healthcare providers as needed.  Risk: moderate   Discussion regarding elective major surgery      ------    I have examined the patient and reviewed the key and critical portions of the history, physical exam, assessment, and plan as presented and documented by the resident. I agree with the medical decision making and the assessment and plan as documented. I have personally edited and revised the note as written. My additions and/or revisions are included in the medical record.     Ann Lions. Deatra Ina, MD  Assistant Clinical Professor  Department of Orthopaedic  Surgery

## 2020-07-23 NOTE — Patient Instructions (Signed)
Sx coordinator card information provided, will contact to proceed surgery.

## 2020-07-24 ENCOUNTER — Encounter: Payer: Self-pay | Admitting: Orthopaedic Surgery

## 2020-07-29 NOTE — Interdisciplinary (Signed)
07/29/2020  4:30 PM     Pre op call    Spoke with Shelly Neal who is scheduled for surgery on 08/01/20 with Dr. Deatra Ina. Reviewed with patient date, time, and location of surgery (address/floor/suite reviewed). Discussed that is ok to eat prior to surgery (no need to be fasting) and appropriate clothing. Transportation needs discussed, patient will not need a driver. Patient is not on any anti-coagulants. All questions answered. Appointment confirmed with patient. Patient requested information to be sent via email as well. Netta Neat, RN

## 2020-08-01 ENCOUNTER — Telehealth: Payer: Self-pay | Admitting: Orthopaedic Surgery

## 2020-08-01 DIAGNOSIS — R2231 Localized swelling, mass and lump, right upper limb: Secondary | ICD-10-CM

## 2020-08-01 NOTE — Telephone Encounter (Signed)
Called patient left voicemail with my call back number -su 08/01/2020

## 2020-08-02 ENCOUNTER — Encounter: Payer: Self-pay | Admitting: Ophthalmology

## 2020-08-02 ENCOUNTER — Ambulatory Visit: Attending: Ophthalmology | Admitting: Ophthalmology

## 2020-08-02 ENCOUNTER — Ambulatory Visit: Payer: Medicare Other | Attending: Ophthalmology | Admitting: Ophthalmology

## 2020-08-02 DIAGNOSIS — H269 Unspecified cataract: Secondary | ICD-10-CM

## 2020-08-02 DIAGNOSIS — H02883 Meibomian gland dysfunction of right eye, unspecified eyelid: Secondary | ICD-10-CM | POA: Insufficient documentation

## 2020-08-02 DIAGNOSIS — H02886 Meibomian gland dysfunction of left eye, unspecified eyelid: Secondary | ICD-10-CM | POA: Insufficient documentation

## 2020-08-02 DIAGNOSIS — H2513 Age-related nuclear cataract, bilateral: Secondary | ICD-10-CM

## 2020-08-02 DIAGNOSIS — H52209 Unspecified astigmatism, unspecified eye: Secondary | ICD-10-CM

## 2020-08-02 DIAGNOSIS — Z9889 Other specified postprocedural states: Secondary | ICD-10-CM | POA: Insufficient documentation

## 2020-08-02 DIAGNOSIS — H04123 Dry eye syndrome of bilateral lacrimal glands: Secondary | ICD-10-CM

## 2020-08-02 NOTE — Progress Notes (Signed)
New patient referred by Dr. Ranae Pila   First seen 08/15/2019     Dr. Charmaine Downs- Rheumatologist        [place] 08/02/2020:    She has started getting infusions for her RA  She states her levels are normal now and she overall feels her health has improved  Uses Xiidra BID - continue  Consider Restasis after surgery    Nuclear sclerosis OU   Visually significant cataracts OU--> also with significant dry eyes  DED improved but still affecting vision    Visually bothered by: glare night driving, difficulty with near vision, does needle point and is having trouble with this activity.  Gares to 20/50 and 20/40-2    RA under control x > 6 mo per pt on increased systemic tx.     Visual goal: distance and wear OTC reading glasses     Factors:   History of hyperopic LASIK OU  Dry eyes in setting of RA     Visual disturbance from cataracts is consistent with some or all of the patient's functional impairment. The risks, benefits, alternatives and possible complications of cataract surgery were discussed in detail. The patient understands that it is not possible to anticipate all possible outcomes or complications from the surgery. The patient also understands that no guarantees can be made regarding the success of the planned surgery. The options for different IOLs were discussed and explained. The patient understands that a change in glasses will not improve the visual function adequately. Given the examination findings, there is a reasonable expectation that the proposed surgery will improve the visual/functional status of the patient. The patient wishes to proceed with surgery. A consent for the proposed surgery was reviewed and signed by the patient.     The patient was evaluated for potential benefit from a Toric IOL. The alternatives have been presented to the patient. The elective nature of these IOLs and the lack of insurance coverage for this component of the surgery were reviewed in detail. The out-of-pocket cost for the  possible IOL was explained. The patient expresses an understanding of this out-of-pocket expense and wishes to consider this option. The need for possible further interventions, including laser vision correction, IOL rotation, corneal astigmatic incisions, and IOL exchange , were reviewed with the patient. The patient also understands that the goal is not to eliminate all of the pre-existing astigmatism, however, de-bulk it. The patient understands that the expectation is not for perfect vision at any or all distances and that spectacles may be needed for some or all activities. The supplemental consent for lifestyle IOLs were reviewed and signed.    Pre-operative drops (antibiotic and NSAID) were prescribed. The patient understands to start these medications 3 days prior to surgery QID in the surgical eye.     The increased variability in IOL power prediction in patients after prior refractive surgery was reviewed with the Ms. Ebert. The increased rate of secondary intervention such as follow-up refractive corneal surgery, piggyback IOL, or IOL exchange was also reviewed.  These additional procedures may have additional fees for the pt.  The patient expresses an understanding of this and wishes to proceed.           Plan: toric IOL OD and catalys OS aim distance.  OD then OS  H/o hyperopic LASIK    **multiple allergies will likely need to send alternative drops (tobramycin ok. Hold acular.   PF ok )    08/15/2019     IOLm TK  OD: 0.86 @ 178  OS: 0.36 @ 133    IOLm K   OD: 0.56 @ 180  OS: 0.60 @ 120    topo  OD:0.79 @ 008  OS: 0.33 @ 025     TCRP:   OD: 1.0 @ 008  OS: 0.4 @ 178    08/29/2019 : here for repeat measurements  IOLm TK   OD: 0.71 @ 176  OS: 0.30@ 028    IOLm K   OD: 0.43 @ 180  OS: 0.19 @ 074    topo  OD: 0.85 @ 009  OS: 0.43 @ 006     TCRP:   OD: 1.2 @ 008  OS: 0.6 @ 179       08/02/2020  IOLm TK   OD: 1.00 @ 004  OS: 0.68 @ 015    IOLm K   OD: 0.73 @ 004  OS: 0.26 @ 017    topo  OD: 1.01 @ 005  OS:  0.28 @ 128     TCRP:   OD: 0.9 @ 5.3  OS: 0.3 @ 132.7    Chord mu:  OD: 0.24 mm  OS: 0.25 mm      Dry eye OU   OD>OS  Uses Xiidra BID- continue  PFAT Q 1-2 hours   WC/LH   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         Incident to Additional Comments by Signing Physician  I reviewed and confirmed all history components, including the HPI; and any other elements performed by the technician.     Adriana Reams MD  Associate Professor of Ophthalmology  Cornea, Cataract, and Refractive Surgery

## 2020-08-03 DIAGNOSIS — H269 Unspecified cataract: Secondary | ICD-10-CM | POA: Insufficient documentation

## 2020-08-03 MED ORDER — PREDNISOLONE ACETATE 1 % OP SUSP
1.0000 [drp] | Freq: Four times a day (QID) | OPHTHALMIC | 0 refills | Status: DC
Start: 2020-08-03 — End: 2020-10-18

## 2020-08-03 MED ORDER — CYCLOSPORINE 0.05 % OP EMUL
1.0000 [drp] | Freq: Two times a day (BID) | OPHTHALMIC | 0 refills | Status: AC
Start: 2020-08-03 — End: ?

## 2020-08-03 MED ORDER — TOBRAMYCIN SULFATE 0.3 % OP SOLN
1.0000 [drp] | OPHTHALMIC | 0 refills | Status: DC
Start: 2020-08-03 — End: 2022-03-19

## 2020-08-03 MED ORDER — CYCLOSPORINE 0.05 % OP EMUL
1.00 [drp] | Freq: Two times a day (BID) | OPHTHALMIC | 0 refills | Status: AC
Start: 2020-08-03 — End: ?

## 2020-08-03 MED ORDER — TOBRAMYCIN SULFATE 0.3 % OP SOLN
1.00 [drp] | OPHTHALMIC | 0 refills | Status: AC
Start: 2020-08-03 — End: ?

## 2020-08-03 MED ORDER — PREDNISOLONE ACETATE 1 % OP SUSP
1.00 [drp] | Freq: Four times a day (QID) | OPHTHALMIC | 0 refills | Status: DC
Start: 2020-08-03 — End: 2020-10-18

## 2020-08-05 ENCOUNTER — Encounter: Payer: Self-pay | Admitting: Hospital

## 2020-08-13 ENCOUNTER — Telehealth: Payer: Self-pay | Admitting: Ophthalmology

## 2020-08-13 ENCOUNTER — Ambulatory Visit: Admitting: Orthopaedic Surgery

## 2020-08-13 ENCOUNTER — Ambulatory Visit: Payer: Medicare Other | Admitting: Orthopaedic Surgery

## 2020-08-13 NOTE — Telephone Encounter (Signed)
Patient would like to know what is she expected to do the day before surgery. She would like to confirm as she is planning to travel. Called Bridgette sx scheduler but call went to voicemail, per patient she already left her a message and has not received a call back. Per patient she needs this information before purchasing plane tickets. Please assist, thank you.

## 2020-08-15 NOTE — Telephone Encounter (Signed)
Called and spoke to patient, emailed her post op instructions

## 2020-08-20 ENCOUNTER — Ambulatory Visit

## 2020-08-20 ENCOUNTER — Ambulatory Visit: Payer: Medicare Other

## 2020-08-20 NOTE — Anesthesia Preprocedure Evaluation (Addendum)
ANESTHESIA PRE-OPERATIVE EVALUATION    Patient Information    Name: Shelly Neal    MRN: [id number]    DOB: 1945-10-29    Age: 74 year old    Sex: female  Procedure(s):  T3 OP* CATARACT EXTRACTION WITH INTRAOCULAR LENS IMPLANT RIGHT EYE      Pre-op Vitals:   There were no vitals taken for this visit.        Primary language spoken:  English    ROS/Medical History:           Special Considerations: Multiple drug allergies, patient states that many medications cause her to break out in a bad rash although she does not get shortness of breath or tongue/lip swelling.  She states that she tolerates Versed and fentanyl well.    General:  Able to do light to moderate activities  Cardiovascular:     Anesthesia History:  PONV,   Pulmonary:      Neuro/Psych:    Hematology/Oncology:       GI/Hepatic:   Infectious Disease:     Renal:   Endocrine/Other:  history of thyroid disease,  arthritis,      Pregnancy History:   Pediatrics:         Pre Anesthesia Testing (PCC/CPC) notes/comments:    [facility] Test & records reviewed by [facility] Provider.                      Data done               Physical Exam    Airway:  Inter-inciser distance > 4 cm    Mallampati: II  Neck ROM: full  TM distance: > 6 cm  Short thick neck: No        Cardiovascular:  - cardiovascular exam normal         Pulmonary:  - pulmonary exam normal           Neuro/Neck/Skeletal/Skin:  - Neck/Neuro/Skeletal/Skin exam normal          Dental:  - normal exam    Abdominal:   - normal exam         Additional Clinical Notes:                                                                                                        Past Medical History:   Diagnosis Date   . Cataract    . Dry eye    . Hypothyroidism    . RA (rheumatoid arthritis) (CMS-HCC)      No past surgical history on file.  Social History     Tobacco Use   . Smoking status: Never Smoker   . Smokeless tobacco: Never Used   Substance Use Topics   . Alcohol use: Never   . Drug use: Never       Current Outpatient  Medications   Medication Sig Dispense Refill   . anterior blue eye kit USED AS INSTRUCTED BY YOUR PHYSICIAN (KIT FOR RIGHT EYE) 1 kit 0   . Azelastine HCl 0.15 %  SOLN 1 drop in each nostril     . budesonide-formoterol (SYMBICORT) 160-4.5 MCG/ACT inhaler Inhale 2 puffs by mouth.     . Carboxymethylcellulose Sodium (THERATEARS) 0.25 % SOLN Place 1 drop into both eyes daily.     . cycloSPORINE (RESTASIS) 0.05 % ophthalmic emulsion Place 1 drop into both eyes 2 times daily. 2 each 0   . fluorometholone (FML) 0.1 % ophthalmic suspension Place 1 drop into both eyes 4 times daily. 1 bottle 0   . fluticasone propionate (FLONASE) 50 MCG/ACT nasal spray 1 spray by Nasal route.     . fluticasone-salmeterol (WIXELA INHUB) 250-50 MCG/DOSE inhaler      . folic acid (FOLVITE) 1 MG tablet 1 mg by Oral route.     . methotrexate (TREXALL) 2.5 MG tablet As Directed     . Olopatadine HCl 0.7 % SOLN Place 1 drop into both eyes daily.     [place] Calcium 500 MG TABS 1 tablet     . prednisoLONE acetate (PRED FORTE) 1 % ophthalmic suspension Place 1 drop into right eye 4 times daily. Start 3 days prior to surgery 1 each 0   . prednisoLONE acetate (PRED FORTE) 1 % ophthalmic suspension Place 1 drop into right eye 4 times daily. 10 mL 0   . predniSONE (DELTASONE) 20 MG tablet by Oral route.     . pregabalin (LYRICA) 25 MG capsule      . sertraline (ZOLOFT) 100 MG tablet Take 150 mg by mouth daily.     Marland Kitchen SYNTHROID 88 MCG tablet Take 1 tablet by mouth daily.     Marland Kitchen tiotropium (SPIRIVA HANDIHALER) 18 MCG inhalation capsule Inhale 18 mcg by mouth.     . tobramycin (TOBREX) 0.3 % ophthalmic solution Place 1 drop into right eye every 4 hours. Start 3 days prior to surgery 1 each 0   . tobramycin (TOBREX) 0.3 % ophthalmic solution Place 1 drop into right eye 4 times daily. 5 mL 0   . tobramycin (TOBREX) 0.3 % ophthalmic solution Place 1 drop into right eye 4 times daily. Start 3 days prior to surgery 1 bottle 0   . traMADol (ULTRAM) 50 MG  tablet 50 mg by Oral route.     Marland Kitchen XIIDRA 5 % SOLN        No current facility-administered medications for this visit.     Allergies   Allergen Reactions   . Cefaclor Hives     Other reaction(s): Pruritic rash (disorder)  Other reaction(s): Pruritic rash (disorder)  No problems with amoxicillin/clavulanate - last taken October 2019  Other reaction(s): Pruritic rash (disorder)  Other reaction(s): Pruritic rash (disorder)  Other reaction(s): Pruritic rash (disorder)    Other reaction(s): Unknown  Other reaction(s): Pruritic rash (disorder)  Other reaction(s): Pruritic rash (disorder)  Other reaction(s): Pruritic rash (disorder)  Other reaction(s): Pruritic rash (disorder)  No problems with amoxicillin/clavulanate - last taken October 2019  Other reaction(s): Pruritic rash (disorder)  Other reaction(s): Pruritic rash (disorder)  No problems with amoxicillin/clavulanate - last taken October 2019  Other reaction(s): Pruritic rash (disorder)  Other reaction(s): Pruritic rash (disorder)  Other reaction(s): Pruritic rash (disorder)     . Ciprofloxacin Rash   . Diagnostic X-Ray Materials Rash   . Fd&C Blue #2 [place] Rash   . Hydromorphone Rash     pruritic rash to left arm - no shortness of breath  pruritic rash to left arm - no shortness of breath  pruritic rash to  left arm - no shortness of breath  pruritic rash to left arm - no shortness of breath  pruritic rash to left arm - no shortness of breath    pruritic rash to left arm - no shortness of breath  pruritic rash to left arm - no shortness of breath  pruritic rash to left arm - no shortness of breath  pruritic rash to left arm - no shortness of breath  pruritic rash to left arm - no shortness of breath  pruritic rash to left arm - no shortness of breath  pruritic rash to left arm - no shortness of breath  pruritic rash to left arm - no shortness of breath  pruritic rash to left arm - no shortness of breath  pruritic rash to left arm - no shortness of breath   .  Ibuprofen Hives and Rash     Aleve    Aleve  Aleve   . Meloxicam Rash   . Naproxen Rash   . Neomycin-Polymyxin B Gu Rash   . Polyethylene Glycol Hives     Other reaction(s): Pruritic rash (disorder)  Other reaction(s): Pruritic rash (disorder)  Other reaction(s): Pruritic rash (disorder)  Other reaction(s): Pruritic rash (disorder)    Other reaction(s): Pruritic rash (disorder)  Other reaction(s): Pruritic rash (disorder)  Other reaction(s): Pruritic rash (disorder)  Other reaction(s): Pruritic rash (disorder)  Other reaction(s): Pruritic rash (disorder)  Other reaction(s): Pruritic rash (disorder)  Other reaction(s): Pruritic rash (disorder)  Other reaction(s): Pruritic rash (disorder)   . Pseudoephedrine Rash   . Sulfamethoxazole W-Trimethoprim Hives     Other reaction(s): Pruritic rash (disorder)  Other reaction(s): Pruritic rash (disorder)    Other reaction(s): Unknown  Other reaction(s): Pruritic rash (disorder)  Other reaction(s): Pruritic rash (disorder)  Other reaction(s): Pruritic rash (disorder)  Other reaction(s): Pruritic rash (disorder)  Other reaction(s): Pruritic rash (disorder)  Other reaction(s): Pruritic rash (disorder)  Other reaction(s): Pruritic rash (disorder)    Other reaction(s): Pruritic rash (disorder)  Other reaction(s): Pruritic rash (disorder)     . Gabapentin Nausea and Vomiting   . Guaifenesin Unspecified   . Homeopathic Products Other       Labs and Other Data  No results found for: NA, SODIUM, K, CL, CO2, BUN, CREAT, GLU, [place]  No results found for: AST, ALT, GGT, LDH, ALK, TP, ALB, TBILI, DBILI  No results found for: WBCCOUNT, RBC, HGB, HCT, MCV, MCHC, RDW, PLT, PLCTEL, MPVH, SEG, LYMPHS, MONOS, EOS, BASOS  No results found for: INR, PTT  No results found for: ARTPH, ARTPO2, ARTPCO2    Anesthesia Plan:  Risks and Benefits of Anesthesia  I personally examined the patient immediately prior to the anesthetic and reviewed the pertinent medical history, drug and allergy history, laboratory  and imaging studies and consultations. I have determined that the patient has had adequate assessment and testing.    Anesthetic techniques, invasive monitors, anesthetic drugs for induction, maintenance and post-operative analgesia, risks and alternatives have been explained to the patient and/or patient's representatives.    I have prescribed the anesthetic plan:         Planned anesthesia method: Monitored Anesthesia Care         ASA 2 (Mild systemic disease)     Potential anesthesia problems identified and risks including but not limited to the following were discussed with patient and/or patient's representative: Adverse or allergic drug reaction, Recall and Injury to brain, heart and other organs    No Beta Blocker Indicated:  Patient not on beta blockersPlanned monitoring method: Routine monitoring    Informed Consent:  Anesthetic plan and risks discussed with Patient.    Plan discussed with CRNA.

## 2020-09-02 ENCOUNTER — Ambulatory Visit
Admission: RE | Admit: 2020-09-02 | Discharge: 2020-09-02 | Disposition: A | Discharge status: Home / Self Care | Attending: Ophthalmology | Admitting: Ophthalmology

## 2020-09-02 ENCOUNTER — Encounter: Payer: Self-pay | Admitting: Ophthalmology

## 2020-09-02 ENCOUNTER — Encounter: Admission: RE | Disposition: A | Discharge status: Home Health | Payer: Self-pay | Attending: Ophthalmology

## 2020-09-02 ENCOUNTER — Ambulatory Visit (HOSPITAL_BASED_OUTPATIENT_CLINIC_OR_DEPARTMENT_OTHER)

## 2020-09-02 ENCOUNTER — Ambulatory Visit

## 2020-09-02 ENCOUNTER — Ambulatory Visit
Admission: RE | Admit: 2020-09-02 | Discharge: 2020-09-02 | Disposition: A | Discharge status: Home / Self Care | Payer: Medicare Other | Attending: Ophthalmology | Admitting: Ophthalmology

## 2020-09-02 ENCOUNTER — Ambulatory Visit: Payer: Medicare Other

## 2020-09-02 ENCOUNTER — Ambulatory Visit (HOSPITAL_BASED_OUTPATIENT_CLINIC_OR_DEPARTMENT_OTHER): Payer: Medicare Other

## 2020-09-02 DIAGNOSIS — H2511 Age-related nuclear cataract, right eye: Secondary | ICD-10-CM

## 2020-09-02 DIAGNOSIS — H52201 Unspecified astigmatism, right eye: Secondary | ICD-10-CM | POA: Insufficient documentation

## 2020-09-02 DIAGNOSIS — H269 Unspecified cataract: Secondary | ICD-10-CM | POA: Insufficient documentation

## 2020-09-02 DIAGNOSIS — Z79890 Hormone replacement therapy (postmenopausal): Secondary | ICD-10-CM | POA: Insufficient documentation

## 2020-09-02 DIAGNOSIS — M069 Rheumatoid arthritis, unspecified: Secondary | ICD-10-CM | POA: Insufficient documentation

## 2020-09-02 DIAGNOSIS — Z79899 Other long term (current) drug therapy: Secondary | ICD-10-CM | POA: Insufficient documentation

## 2020-09-02 DIAGNOSIS — E039 Hypothyroidism, unspecified: Secondary | ICD-10-CM | POA: Insufficient documentation

## 2020-09-02 DIAGNOSIS — H527 Unspecified disorder of refraction: Secondary | ICD-10-CM

## 2020-09-02 HISTORY — PX: CATARACT EXTRACTION: SHX1313B

## 2020-09-02 SURGERY — PHACOEMULSIFICATION, CATARACT
Anesthesia: Monitored Anesthesia Care (MAC) | Site: Eye | Laterality: Right | Wound class: Class I (Clean)

## 2020-09-02 MED ORDER — SODIUM CHLORIDE 0.9 % IV SOLN
INTRAVENOUS | Status: DC | PRN
Start: 2020-09-02 — End: 2020-09-02

## 2020-09-02 MED ORDER — TETRACAINE HCL 0.5 % OP SOLN
OPHTHALMIC | Status: AC
Start: 2020-09-02 — End: 2020-09-02
  Filled 2020-09-02: qty 5

## 2020-09-02 MED ORDER — SODIUM CHLORIDE 0.9 % IV SOLN
INTRAVENOUS | Status: DC
Start: 2020-09-02 — End: 2020-09-02

## 2020-09-02 MED ORDER — PHENYLEPHRINE HCL 2.5 % OP SOLN
1.0000 [drp] | OPHTHALMIC | Status: AC
Start: 2020-09-02 — End: 2020-09-02
  Administered 2020-09-02 (×4): 1 [drp] via OPHTHALMIC

## 2020-09-02 MED ORDER — SODIUM HYALURONATE 30 MG/ML IO SOLN (~~LOC~~)
INTRAOCULAR | Status: DC | PRN
Start: 2020-09-02 — End: 2020-09-02
  Administered 2020-09-02: 1.4 mL via INTRAOCULAR

## 2020-09-02 MED ORDER — TROPICAMIDE 1 % OP SOLN
OPHTHALMIC | Status: AC
Start: 2020-09-02 — End: 2020-09-02
  Filled 2020-09-02: qty 15

## 2020-09-02 MED ORDER — TOBRAMYCIN SULFATE 0.3 % OP SOLN
1.0000 [drp] | OPHTHALMIC | Status: AC
Start: 2020-09-02 — End: 2020-09-02
  Administered 2020-09-02 (×3): 1 [drp] via OPHTHALMIC

## 2020-09-02 MED ORDER — TETRACAINE HCL 0.5 % OP SOLN
1.0000 [drp] | OPHTHALMIC | Status: AC
Start: 2020-09-02 — End: 2020-09-02
  Administered 2020-09-02 (×4): 1 [drp] via OPHTHALMIC

## 2020-09-02 MED ORDER — NALOXONE HCL 0.4 MG/ML IJ SOLN
0.2000 mg | Freq: Once | INTRAMUSCULAR | Status: DC | PRN
Start: 2020-09-02 — End: 2020-09-02

## 2020-09-02 MED ORDER — SODIUM CHLORIDE 0.9 % IV SOLN
500.0000 mL | INTRAVENOUS | Status: DC
Start: 2020-09-02 — End: 2020-09-02
  Administered 2020-09-02: 500 mL via INTRAVENOUS

## 2020-09-02 MED ORDER — PREDNISOLONE ACETATE 1 % OP SUSP
1.0000 [drp] | Freq: Once | OPHTHALMIC | Status: AC
Start: 2020-09-02 — End: 2020-09-02
  Administered 2020-09-02: 1 [drp] via OPHTHALMIC

## 2020-09-02 MED ORDER — PHENYLEPHRINE HCL 2.5 % OP SOLN
OPHTHALMIC | Status: AC
Start: 2020-09-02 — End: 2020-09-02
  Filled 2020-09-02: qty 15

## 2020-09-02 MED ORDER — BSS IO SOLN - 500 ML (OPTIME)
INTRAOCULAR | Status: DC | PRN
Start: 2020-09-02 — End: 2020-09-02
  Administered 2020-09-02 (×2): 500 mL via INTRAOCULAR

## 2020-09-02 MED ORDER — ACETAMINOPHEN 325 MG PO TABS
650.0000 mg | ORAL_TABLET | ORAL | Status: DC | PRN
Start: 2020-09-02 — End: 2020-09-02

## 2020-09-02 MED ORDER — CETIRIZINE HCL 10 MG OR TABS
10.0000 mg | ORAL_TABLET | Freq: Every day | ORAL | Status: DC
Start: ? — End: 2022-03-19

## 2020-09-02 MED ORDER — MIDAZOLAM HCL 2 MG/2ML IJ SOLN
INTRAMUSCULAR | Status: DC | PRN
Start: 2020-09-02 — End: 2020-09-02
  Administered 2020-09-02 (×3): 1 mg via INTRAVENOUS

## 2020-09-02 MED ORDER — LIDOCAINE HCL (PF) 1 % IJ SOLN
INTRAMUSCULAR | Status: DC | PRN
Start: 2020-09-02 — End: 2020-09-02
  Administered 2020-09-02: 2 mL via INTRACAMERAL

## 2020-09-02 MED ORDER — STERILE WATER FOR IRRIGATION IR SOLN
Status: DC | PRN
Start: 2020-09-02 — End: 2020-09-02
  Administered 2020-09-02 (×2): 500 mL

## 2020-09-02 MED ORDER — BSS IO SOLN
INTRAOCULAR | Status: DC | PRN
Start: 2020-09-02 — End: 2020-09-02
  Administered 2020-09-02: 5 mL

## 2020-09-02 MED ORDER — TROPICAMIDE 1 % OP SOLN
1.0000 [drp] | OPHTHALMIC | Status: AC
Start: 2020-09-02 — End: 2020-09-02
  Administered 2020-09-02 (×3): 1 [drp] via OPHTHALMIC

## 2020-09-02 MED ORDER — SODIUM CHLORIDE 0.9 % IV SOLN
10.0000 mL/h | INTRAVENOUS | Status: DC
Start: 2020-09-02 — End: 2020-09-02

## 2020-09-02 MED ORDER — MIDAZOLAM HCL (PF) 2 MG/2ML IJ SOLN
INTRAMUSCULAR | Status: AC
Start: 2020-09-02 — End: 2020-09-02
  Filled 2020-09-02: qty 2

## 2020-09-02 MED ORDER — POVIDONE-IODINE 5 % OP SOLN
OPHTHALMIC | Status: DC | PRN
Start: 2020-09-02 — End: 2020-09-02
  Administered 2020-09-02 (×2): 5 [drp] via OPHTHALMIC

## 2020-09-02 MED ORDER — MIDAZOLAM HCL 2 MG/2ML IJ SOLN
INTRAMUSCULAR | Status: DC | PRN
Start: 2020-09-02 — End: 2020-09-02
  Administered 2020-09-02 (×2): 1 mg via INTRAVENOUS

## 2020-09-02 MED ORDER — CETIRIZINE HCL 10 MG OR TABS: 10.00 mg | ORAL_TABLET | Freq: Every day | ORAL | Status: AC

## 2020-09-02 MED ORDER — PHENYLEPHRINE HCL 2.5 % OP SOLN
1.00 [drp] | OPHTHALMIC | Status: AC
Start: 2020-09-02 — End: 2020-09-02
  Administered 2020-09-02 (×3): 1 [drp] via OPHTHALMIC

## 2020-09-02 MED ORDER — NALOXONE HCL 0.4 MG/ML IJ SOLN
0.20 mg | Freq: Once | INTRAMUSCULAR | Status: DC | PRN
Start: 2020-09-02 — End: 2020-09-02

## 2020-09-02 MED ORDER — MIDAZOLAM HCL (PF) 2 MG/2ML IJ SOLN
INTRAMUSCULAR | Status: AC
Start: 2020-09-02 — End: ?

## 2020-09-02 MED ORDER — STERILE WATER FOR IRRIGATION IR SOLN
Status: DC | PRN
Start: 2020-09-02 — End: 2020-09-02
  Administered 2020-09-02: 500 mL

## 2020-09-02 MED ORDER — BSS IO SOLN - 500 ML (OPTIME)
INTRAOCULAR | Status: DC | PRN
Start: 2020-09-02 — End: 2020-09-02
  Administered 2020-09-02: 500 mL via INTRAOCULAR

## 2020-09-02 MED ORDER — TROPICAMIDE 1 % OP SOLN
1.00 [drp] | OPHTHALMIC | Status: AC
Start: 2020-09-02 — End: 2020-09-02
  Administered 2020-09-02 (×3): 1 [drp] via OPHTHALMIC

## 2020-09-02 MED ORDER — TOBRAMYCIN SULFATE 0.3 % OP SOLN
1.00 [drp] | OPHTHALMIC | Status: AC
Start: 2020-09-02 — End: 2020-09-02
  Administered 2020-09-02 (×3): 1 [drp] via OPHTHALMIC

## 2020-09-02 MED ORDER — TETRACAINE HCL 0.5 % OP SOLN
1.00 [drp] | OPHTHALMIC | Status: AC
Start: 2020-09-02 — End: 2020-09-02
  Administered 2020-09-02 (×3): 1 [drp] via OPHTHALMIC

## 2020-09-02 MED ORDER — PHENYLEPHRINE HCL 2.5 % OP SOLN
OPHTHALMIC | Status: AC
Start: 2020-09-02 — End: ?

## 2020-09-02 MED ORDER — ACETAMINOPHEN 325 MG PO TABS
650.00 mg | ORAL_TABLET | ORAL | Status: DC | PRN
Start: 2020-09-02 — End: 2020-09-02

## 2020-09-02 MED ORDER — SODIUM CHLORIDE 0.9 % IV SOLN
10.00 mL/h | INTRAVENOUS | Status: DC
Start: 2020-09-02 — End: 2020-09-02

## 2020-09-02 MED ORDER — POVIDONE-IODINE 5 % OP SOLN
OPHTHALMIC | Status: DC | PRN
Start: 2020-09-02 — End: 2020-09-02
  Administered 2020-09-02: 5 [drp] via OPHTHALMIC

## 2020-09-02 MED ORDER — TETRACAINE HCL 0.5 % OP SOLN
OPHTHALMIC | Status: AC
Start: 2020-09-02 — End: ?

## 2020-09-02 MED ORDER — TROPICAMIDE 1 % OP SOLN
OPHTHALMIC | Status: AC
Start: 2020-09-02 — End: ?

## 2020-09-02 MED ORDER — SODIUM CHLORIDE 0.9 % IV SOLN
500.00 mL | INTRAVENOUS | Status: DC
Start: 2020-09-02 — End: 2020-09-02
  Administered 2020-09-02: 500 mL via INTRAVENOUS

## 2020-09-02 MED ORDER — PREDNISOLONE ACETATE 1 % OP SUSP
1.00 [drp] | Freq: Once | OPHTHALMIC | Status: AC
Start: 2020-09-02 — End: 2020-09-02
  Administered 2020-09-02: 1 [drp] via OPHTHALMIC

## 2020-09-02 SURGICAL SUPPLY — 27 items
BLADE 15 SHRP BP RB-BCK CBNSTL SRG STRL LF (Knives/Blades) ×2 IMPLANT
BLADE CC 2.75 ANG DBL BEV (Knives/Blades) ×2 IMPLANT
BLADE INTUBRITE MAC 3 DISP (Knives/Blades) ×2 IMPLANT
BLADE WECK PREP STER (Knives/Blades) IMPLANT
CANNULA OPHTHALMIC 27G X 7/8IN ANTERIOR CHAMBER (Needles/punch/cannula/biopsy) IMPLANT
CUTTER VITRECTOMY SIGNATURE OD20 GA VITREOUS HIGH SPEED LATEX FREE DISPOSABLE AVIT (Lap/Endo/Arthroscopy) IMPLANT
CYSTOTOME IRRIGATION 25G X 5/8IN PEARCE (Disp Instruments) IMPLANT
HANDLE MED/STANDARD DISP (Misc Surgical Supply) IMPLANT
KNIFE OPHTHALMIC STAB 15DEG STRAIGHT GREEN OPTIMUM (Knives/Blades) ×2 IMPLANT
KNIFE OPTH 2.75MM XSTR 45D 2 BVL SLT LF (Knives/Blades) ×2 IMPLANT
KNIFE SNGL BVL 2.4 MM INTREPID (Knives/Blades) IMPLANT
Lens IOL Tecnis Tor II Simplicity Eyhance CYL2.25 21.5D (Lens) ×2 IMPLANT
NEEDLE PHACO SLEEVE (Procedure Packs/kits) IMPLANT
Needle Ophthalmic 25G x 1.5in Atkinson Tip Retrobulbar (Needles/punch/cannula/biopsy) IMPLANT
PACK CATARACT REV 9 (Procedure Packs/kits) ×2 IMPLANT
PACK PHACOEMULSIFICATION BAUSCH LOMB OD2.6 MM VACUUM SLEEVE STERILE LATEX FREE DISPOSABLE (Procedure Packs/kits) IMPLANT
Pack Phaco Advanced Fluidics Tubing Veritas (Procedure Packs/kits) ×2 IMPLANT
Pack Pump Dual Phacoemulsification (Procedure Packs/kits) IMPLANT
RETRACTOR IRIS FLX X 5EA STER DISP (Reusable instruments/equipment) IMPLANT
RING OPTH MALYUGIN RING 6.25MM 2MM INCS (Reusable instruments/equipment) IMPLANT
SHIELD EYE LASER BINOCULAR 60-7000 (Misc Surgical Supply) IMPLANT
SLEEVE TEST 20 GA PURPLE (Misc Medical Supply) IMPLANT
SPEARS SURGICAL (Disp Instruments) IMPLANT
SUTURE CHARGE - OPHTHALMIC (Misc Surgical Supply) IMPLANT
TIP PHACO 12 OZIL (Misc Medical Supply) IMPLANT
TIP PHACO CVD 20 GA OPOCR3020R (Misc Medical Supply) IMPLANT
TIP PHACO STR 20 GA OPOR3020L (Misc Medical Supply) IMPLANT

## 2020-09-02 SURGICAL SUPPLY — 27 items
BLADE 15 SHRP BP RB-BCK CBNSTL SRG STRL LF (Knives/Blades) ×1
BLADE CC 2.75 ANG DBL BEV (Knives/Blades) ×1
BLADE INTUBRITE MAC 3 DISP (Knives/Blades) ×1
BLADE WECK PREP STER (Knives/Blades)
CANNULA OPHTHALMIC 27G X 7/8IN ANTERIOR CHAMBER (Needles/punch/cannula/biopsy)
CUTTER VITRECTOMY SIGNATURE OD20 GA VITREOUS HIGH SPEED LATEX FREE DISPOSABLE AVIT (Lap/Endo/Arthroscopy)
CYSTOTOME IRRIGATION 25G X 5/8IN PEARCE (Disp Instruments)
HANDLE MED/STANDARD DISP (Misc Surgical Supply)
KNIFE OPHTHALMIC STAB 15DEG STRAIGHT GREEN OPTIMUM (Knives/Blades) ×1
KNIFE OPTH 2.75MM XSTR 45D 2 BVL SLT LF (Knives/Blades) ×1
KNIFE SNGL BVL 2.4 MM INTREPID (Knives/Blades)
Lens IOL Tecnis Tor II Simplicity Eyhance CYL2.25 21.5D (Lens) ×1 IMPLANT
NEEDLE PHACO SLEEVE (Procedure Packs/kits)
Needle Ophthalmic 25G x 1.5in Atkinson Tip Retrobulbar (Needles/punch/cannula/biopsy)
PACK CATARACT REV 9 (Procedure Packs/kits) ×1
PACK PHACOEMULSIFICATION BAUSCH LOMB OD2.6 MM VACUUM SLEEVE STERILE LATEX FREE DISPOSABLE (Procedure Packs/kits)
Pack Phaco Advanced Fluidics Tubing Veritas (Procedure Packs/kits) ×1
Pack Pump Dual Phacoemulsification (Procedure Packs/kits)
RETRACTOR IRIS FLX X 5EA STER DISP (Reusable instruments/equipment)
RING OPTH MALYUGIN RING 6.25MM 2MM INCS (Reusable instruments/equipment)
SHIELD EYE LASER BINOCULAR 60-7000 (Misc Surgical Supply)
SLEEVE TEST 20 GA PURPLE (Misc Medical Supply)
SPEARS SURGICAL (Disp Instruments)
SUTURE CHARGE - OPHTHALMIC (Misc Surgical Supply)
TIP PHACO 12 OZIL (Misc Medical Supply)
TIP PHACO CVD 20 GA OPOCR3020R (Misc Medical Supply)
TIP PHACO STR 20 GA OPOR3020L (Misc Medical Supply)

## 2020-09-02 NOTE — Op Note (Signed)
Date of Surgery: 09/02/2020    Preoperative Diagnosis(es)  Visually significant age related nuclear sclerotic cataract and astigmatism of the Right eye    Postoperative Diagnosis(es)  Same as above    Operation Performed  1. Cataract extraction with placement of intraocular lens Right eye    Refractive Component  2. Astigmatic correction, Right eye (refractive)    Surgeon(s): Adriana Reams, MD  Assistant(s):  Terie Purser, MD  Resident  Anesthesia: Topical, MAC.  Estimated Blood Loss: None.  Drains: None.  IV Fluids: KVO  Specimens/Lesions Removed: Cataract  Disposition of Each Specimen: Discarded.  Complications: None.  Condition on Discharge: Stable.  Lens Type and Power:     Implant Name Type Inv. Item Serial No. Manufacturer Lot No. LRB No. Used Action   LENS IOL TECNIS TOR II SIMPLICITY EYHANCE ONG2.95 21.5D - [id number] Lens Lens IOL Tecnis Tor II Simplicity Backus UVO5.36 21.5D [id number] [place]  Right 1 Implanted       IOL oriented at 002 degrees    Indications: Loss of visual function attributable to cataract formation.     Procedure in Detail  In the holding area, the patient was placed in an upright seated position and the 3 o'clock and 9 o'clock positions of the surgical eye was marked to guide correct orientation of the intraocular lens.  After suitable preoperative medication, the patient was brought into the Operating Suite, prepped with Betadine, and draped with an incise drape cut to cover the lashes and meibomian glands when the lid speculum was placed. A surgeon's time out was performed to re-affirm the correct patient, surgical site, and surgery to be performed.  A toric marker was then used to mark the correct axis of alignment for the IOL.  A paracentesis port was fashioned using a 15-degree blade, the anterior chamber was instilled with 1% preservative-free lidocaine, and then the anterior chamber was inflated with viscoelastic.  A 2.75-mm-wide keratome was  used to create a main incision with a square configuration.  The Rhein cystotome capsulorrhexis forceps created a continuous curvilinear anterior capsulotomy, followed by hydrodissection with the Chang cannula. The nucleus was then removed by phacoemulsification in the posterior chamber using a horizontal chop method.  Any epinucleus was then aspirated by the phaco tip, followed by removing the residual cortex thoroughly using irrigation and aspiration.  The capsular bag was inflated with a cohesive viscoelastic agent. ORA was then used to confirm correct lens power and orientation. The IOL was then injected in the capsular bag without complication, positioning the trailing haptic with the injector tip or Lester hook.  The lens was then oriented to the corrected meridian with aid of the Panacea.  The lens was noted to be well centered.  The remaining viscoelastic was then removed from the chamber and the capsular bag using irrigation and aspiration. The main incision and paracentesis stroma were hydrated. The intraocular pressure was elevated with BSS and the incisions carefully tested for water-tight closure. The IOP was then lowered and the patient's vision grossly verified.  The correct orientation of the lens was verified.  The speculum was removed and antibiotics drops were placed in the eye. A clear shield was placed on the patient's operative eye. The patient left the operating room in satisfactory condition, having tolerated the procedure well.

## 2020-09-02 NOTE — Anesthesia Postprocedure Evaluation (Signed)
Anesthesia Post Note    Patient: Shelly Neal    Procedure(s) Performed: Procedure(s):  T3 OP* CATARACT EXTRACTION WITH INTRAOCULAR LENS IMPLANT RIGHT EYE      Final anesthesia type: Monitored Anesthesia Care    Patient location: PACU    Post anesthesia pain: adequate analgesia    Mental status: awake, alert  and oriented    Airway Patent: Yes    Last Vitals:    Vitals Value Taken Time   BP 110/53 09/02/20 1125   Temp 36 C 09/02/20 1120   Pulse 55 09/02/20 1125   Resp 16 09/02/20 1125   SpO2 97 % 09/02/20 1125        Temperature >35.5: Yes    Post vital signs: stable    Hydration: adequate    N/V:no    Plan of care per primary team.

## 2020-09-02 NOTE — Discharge Instructions (Signed)
Monitored Anesthesia Care, Care After  These instructions provide you with information about caring for yourself after your procedure. Your health care provider may also give you more specific instructions. Your treatment has been planned according to current medical practices, but problems sometimes occur. Call your health care provider if you have any problems or questions after your procedure.  What can I expect after the procedure?  After your procedure, you may:  Feel sleepy for several hours.  Feel clumsy and have poor balance for several hours.  Feel forgetful about what happened after the procedure.  Have poor judgment for several hours.  Feel nauseous or vomit.  Have a sore throat if you had a breathing tube during the procedure.  Follow these instructions at home:  For at least 24 hours after the procedure:         Have a responsible adult stay with you. It is important to have someone help care for you until you are awake and alert.  Rest as needed.  Do not:  Participate in activities in which you could fall or become injured.  Drive.  Use heavy machinery.  Drink alcohol.  Take sleeping pills or medicines that cause drowsiness.  Make important decisions or sign legal documents.  Take care of children on your own.  Eating and drinking  Follow the diet that is recommended by your health care provider.  If you vomit, drink water, juice, or soup when you can drink without vomiting.  Make sure you have little or no nausea before eating solid foods.  General instructions  Take over-the-counter and prescription medicines only as told by your health care provider.  If you have sleep apnea, surgery and certain medicines can increase your risk for breathing problems. Follow instructions from your health care provider about wearing your sleep device:  Anytime you are sleeping, including during daytime naps.  While taking prescription pain medicines, sleeping medicines, or medicines that make you drowsy.  If you  smoke, do not smoke without supervision.  Keep all follow-up visits as told by your health care provider. This is important.  Contact a health care provider if:  You keep feeling nauseous or you keep vomiting.  You feel light-headed.  You develop a rash.  You have a fever.  Get help right away if:  You have trouble breathing.  Summary  For several hours after your procedure, you may feel sleepy and have poor judgment.  Have a responsible adult stay with you for at least 24 hours or until you are awake and alert.  This information is not intended to replace advice given to you by your health care provider. Make sure you discuss any questions you have with your health care provider.  Document Released: 12/22/2015 Document Revised: 04/16/2017 Document Reviewed: 12/22/2015  [employer]vier Interactive Patient Education  2019 [employer]

## 2020-09-02 NOTE — H&P (Signed)
OUTPATIENT PROCEDURE H&P    09/02/2020    HPI/Reason for surgery: Cataract, unspecified cataract type, unspecified laterality [H26.9]   Surgeon: Jerilynn Mages. Alveta Heimlich, MD  Department: Ophthalmology     Review of Systems:  No F/C/N/V/SOB/CP/Headache/Diarrhea/Arthalgias    Past medical history:  Past Medical History:   Diagnosis Date   . Cataract    . Dry eye    . Hypothyroidism    . RA (rheumatoid arthritis) (CMS-HCC)      No past surgical history on file.  No family history on file.    Social history:  Social History     Socioeconomic History   . Marital status: Married     Spouse name: Not on file   . Number of children: Not on file   . Years of education: Not on file   . Highest education level: Not on file   Occupational History   . Not on file   Tobacco Use   . Smoking status: Never Smoker   . Smokeless tobacco: Never Used   Substance and Sexual Activity   . Alcohol use: Never   . Drug use: Never   . Sexual activity: Not on file   Other Topics Concern   . Not on file   Social History Narrative   . Not on file     Social Determinants of Health     Financial Resource Strain:    . Difficulty of Paying Living Expenses: Not on file   Food Insecurity:    . Worried About Charity fundraiser in the Last Year: Not on file   . Ran Out of Food in the Last Year: Not on file   Transportation Needs:    . Lack of Transportation (Medical): Not on file   . Lack of Transportation (Non-Medical): Not on file   Physical Activity:    . Days of Exercise per Week: Not on file   . Minutes of Exercise per Session: Not on file   Stress:    . Feeling of Stress : Not on file   Social Connections:    . Frequency of Communication with Friends and Family: Not on file   . Frequency of Social Gatherings with Friends and Family: Not on file   . Attends Religious Services: Not on file   . Active Member of Clubs or Organizations: Not on file   . Attends Archivist Meetings: Not on file   . Marital Status: Not on file   Intimate Partner Violence:    .  Fear of Current or Ex-Partner: Not on file   . Emotionally Abused: Not on file   . Physically Abused: Not on file   . Sexually Abused: Not on file   Housing Stability:    . Unable to Pay for Housing in the Last Year: Not on file   . Number of Places Lived in the Last Year: Not on file   . Unstable Housing in the Last Year: Not on file     No smoking or drug abuse    Medications:  No current facility-administered medications on file prior to encounter.     Current Outpatient Medications on File Prior to Encounter   Medication Sig Dispense Refill   . anterior blue eye kit USED AS INSTRUCTED BY YOUR PHYSICIAN (KIT FOR RIGHT EYE) 1 kit 0   . Azelastine HCl 0.15 % SOLN 1 drop in each nostril     . budesonide-formoterol (SYMBICORT) 160-4.5 MCG/ACT inhaler Inhale 2 puffs  by mouth.     . Carboxymethylcellulose Sodium (THERATEARS) 0.25 % SOLN Place 1 drop into both eyes daily.     . cetirizine (ZYRTEC) 10 MG tablet Take 10 mg by mouth daily.     . cycloSPORINE (RESTASIS) 0.05 % ophthalmic emulsion Place 1 drop into both eyes 2 times daily. 2 each 0   . fluorometholone (FML) 0.1 % ophthalmic suspension Place 1 drop into both eyes 4 times daily. 1 bottle 0   . fluticasone propionate (FLONASE) 50 MCG/ACT nasal spray 1 spray by Nasal route.     . fluticasone-salmeterol (WIXELA INHUB) 250-50 MCG/DOSE inhaler      . folic acid (FOLVITE) 1 MG tablet 1 mg by Oral route.     . methotrexate (TREXALL) 2.5 MG tablet As Directed     . Olopatadine HCl 0.7 % SOLN Place 1 drop into both eyes daily.     [place] Calcium 500 MG TABS 1 tablet     . prednisoLONE acetate (PRED FORTE) 1 % ophthalmic suspension Place 1 drop into right eye 4 times daily. Start 3 days prior to surgery 1 each 0   . prednisoLONE acetate (PRED FORTE) 1 % ophthalmic suspension Place 1 drop into right eye 4 times daily. 10 mL 0   . [DISCONTINUED] predniSONE (DELTASONE) 20 MG tablet by Oral route.     . [DISCONTINUED] pregabalin (LYRICA) 25 MG capsule      .  sertraline (ZOLOFT) 100 MG tablet Take 150 mg by mouth daily.     Marland Kitchen SYNTHROID 88 MCG tablet Take 1 tablet by mouth daily.     . [DISCONTINUED] tiotropium (SPIRIVA HANDIHALER) 18 MCG inhalation capsule Inhale 18 mcg by mouth.     . tobramycin (TOBREX) 0.3 % ophthalmic solution Place 1 drop into right eye every 4 hours. Start 3 days prior to surgery 1 each 0   . tobramycin (TOBREX) 0.3 % ophthalmic solution Place 1 drop into right eye 4 times daily. 5 mL 0   . tobramycin (TOBREX) 0.3 % ophthalmic solution Place 1 drop into right eye 4 times daily. Start 3 days prior to surgery 1 bottle 0   . [DISCONTINUED] traMADol (ULTRAM) 50 MG tablet 50 mg by Oral route.     Marland Kitchen XIIDRA 5 % SOLN          Allergies:  Allergies   Allergen Reactions   . Cefaclor Hives     Other reaction(s): Pruritic rash (disorder)  Other reaction(s): Pruritic rash (disorder)  No problems with amoxicillin/clavulanate - last taken October 2019  Other reaction(s): Pruritic rash (disorder)  Other reaction(s): Pruritic rash (disorder)  Other reaction(s): Pruritic rash (disorder)    Other reaction(s): Unknown  Other reaction(s): Pruritic rash (disorder)  Other reaction(s): Pruritic rash (disorder)  Other reaction(s): Pruritic rash (disorder)  Other reaction(s): Pruritic rash (disorder)  No problems with amoxicillin/clavulanate - last taken October 2019  Other reaction(s): Pruritic rash (disorder)  Other reaction(s): Pruritic rash (disorder)  No problems with amoxicillin/clavulanate - last taken October 2019  Other reaction(s): Pruritic rash (disorder)  Other reaction(s): Pruritic rash (disorder)  Other reaction(s): Pruritic rash (disorder)     . Ciprofloxacin Rash   . Diagnostic X-Ray Materials Rash   . Fd&C Blue #2 [place] Rash   . Hydromorphone Rash     pruritic rash to left arm - no shortness of breath  pruritic rash to left arm - no shortness of breath  pruritic rash to left arm - no shortness of  breath  pruritic rash to left arm - no shortness of  breath  pruritic rash to left arm - no shortness of breath    pruritic rash to left arm - no shortness of breath  pruritic rash to left arm - no shortness of breath  pruritic rash to left arm - no shortness of breath  pruritic rash to left arm - no shortness of breath  pruritic rash to left arm - no shortness of breath  pruritic rash to left arm - no shortness of breath  pruritic rash to left arm - no shortness of breath  pruritic rash to left arm - no shortness of breath  pruritic rash to left arm - no shortness of breath  pruritic rash to left arm - no shortness of breath   . Ibuprofen Hives and Rash     Aleve    Aleve  Aleve   . Meloxicam Rash   . Naproxen Rash   . Neomycin-Polymyxin B Gu Rash   . Polyethylene Glycol Hives     Other reaction(s): Pruritic rash (disorder)  Other reaction(s): Pruritic rash (disorder)  Other reaction(s): Pruritic rash (disorder)  Other reaction(s): Pruritic rash (disorder)    Other reaction(s): Pruritic rash (disorder)  Other reaction(s): Pruritic rash (disorder)  Other reaction(s): Pruritic rash (disorder)  Other reaction(s): Pruritic rash (disorder)  Other reaction(s): Pruritic rash (disorder)  Other reaction(s): Pruritic rash (disorder)  Other reaction(s): Pruritic rash (disorder)  Other reaction(s): Pruritic rash (disorder)   . Pseudoephedrine Rash   . Sulfamethoxazole W-Trimethoprim Hives     Other reaction(s): Pruritic rash (disorder)  Other reaction(s): Pruritic rash (disorder)    Other reaction(s): Unknown  Other reaction(s): Pruritic rash (disorder)  Other reaction(s): Pruritic rash (disorder)  Other reaction(s): Pruritic rash (disorder)  Other reaction(s): Pruritic rash (disorder)  Other reaction(s): Pruritic rash (disorder)  Other reaction(s): Pruritic rash (disorder)  Other reaction(s): Pruritic rash (disorder)    Other reaction(s): Pruritic rash (disorder)  Other reaction(s): Pruritic rash (disorder)     . Gabapentin Nausea and Vomiting   . Guaifenesin Unspecified   .  Homeopathic Products Other        Pre-op Physical Exam:    Vitals:   Temperature:  [97.2 F (36.2 C)] 97.2 F (36.2 C) (12/20 0844)  Blood pressure (BP): (119)/(58) 119/58 (12/20 0844)  Heart Rate:  [60] 60 (12/20 0844)  Respirations:  [18] 18 (12/20 0844)  Pain Score: 0 (12/20 0844)  O2 Device: None (Room air) (12/20 0844)  SpO2:  [98 %] 98 % (12/20 0844)  (see nurses notes)    ? HEENT: NCAT, PERRL, EOMI  ? Cardiovascular: RRR  ? Respiratory: CTAB  ? Abdomen: soft, NT, ND  ? Neurologic: A&Ox 3, Normal  ? Extremities: Normal    Assessment & Plan: Cataract, unspecified cataract type, unspecified laterality [H26.9]     Proceed with surgery as planned under MAC anesthesia.    We rediscussed the risks, benefits, and alternatives; we also explained there is no guarantee or warrantee to the recommended treatment. A more detailed list of risks can be found on informed written consent form that is signed & complete.

## 2020-09-03 ENCOUNTER — Encounter: Payer: Self-pay | Admitting: Ophthalmology

## 2020-09-03 ENCOUNTER — Ambulatory Visit: Admitting: Ophthalmology

## 2020-09-03 ENCOUNTER — Ambulatory Visit: Attending: Ophthalmology | Admitting: Ophthalmology

## 2020-09-03 ENCOUNTER — Ambulatory Visit: Payer: Medicare Other | Attending: Ophthalmology | Admitting: Ophthalmology

## 2020-09-03 ENCOUNTER — Ambulatory Visit: Payer: Medicare Other | Admitting: Ophthalmology

## 2020-09-03 DIAGNOSIS — Z961 Presence of intraocular lens: Secondary | ICD-10-CM | POA: Insufficient documentation

## 2020-09-03 NOTE — Progress Notes (Signed)
s/p CEIOL OD POD#1 (09/02/20) - eyhance toric, aim plano in the setting of hyperopic LASIK     doing well  IOP 27 - monitor     Patient recovery is progressing as expected. Written medication (steroid/antibiotic/NSAID)s and post-op instructions were reviewed. The patient was reminded to call immediately if any deterioration in vision or new onset of symptoms occurs, emphasizing that the patient is postoperative and needs to be seen as an emergency.    Follow up as scheduled 09/23/20    I reviewed and confirmed all history components, including the HPI; and any other elements performed by the technician. I performed the key and critical portions of the exam. The final examination findings, image interpretations, and plan as documented in the record represent my personal judgment and conclusions.    Suzan Nailer, MD   Cornea and Refractive Surgery Fellow                PRIOR TO SURGERY:  New patient referred by Dr. Ranae Pila   First seen 08/15/2019     Dr. Charmaine Downs- Rheumatologist        [place] 08/02/2020:    She has started getting infusions for her RA  She states her levels are normal now and she overall feels her health has improved  Uses Xiidra BID - continue  Consider Restasis after surgery    Nuclear sclerosis OU   Visually significant cataracts OU--> also with significant dry eyes  DED improved but still affecting vision    Visually bothered by: glare night driving, difficulty with near vision, does needle point and is having trouble with this activity.  Gares to 20/50 and 20/40-2    RA under control x > 6 mo per pt on increased systemic tx.     Visual goal: distance and wear OTC reading glasses     Factors:   History of hyperopic LASIK OU  Dry eyes in setting of RA     Visual disturbance from cataracts is consistent with some or all of the patient's functional impairment. The risks, benefits, alternatives and possible complications of cataract surgery were discussed in detail. The patient understands that it is not  possible to anticipate all possible outcomes or complications from the surgery. The patient also understands that no guarantees can be made regarding the success of the planned surgery. The options for different IOLs were discussed and explained. The patient understands that a change in glasses will not improve the visual function adequately. Given the examination findings, there is a reasonable expectation that the proposed surgery will improve the visual/functional status of the patient. The patient wishes to proceed with surgery. A consent for the proposed surgery was reviewed and signed by the patient.     The patient was evaluated for potential benefit from a Toric IOL. The alternatives have been presented to the patient. The elective nature of these IOLs and the lack of insurance coverage for this component of the surgery were reviewed in detail. The out-of-pocket cost for the possible IOL was explained. The patient expresses an understanding of this out-of-pocket expense and wishes to consider this option. The need for possible further interventions, including laser vision correction, IOL rotation, corneal astigmatic incisions, and IOL exchange , were reviewed with the patient. The patient also understands that the goal is not to eliminate all of the pre-existing astigmatism, however, de-bulk it. The patient understands that the expectation is not for perfect vision at any or all distances and that spectacles may be needed  for some or all activities. The supplemental consent for lifestyle IOLs were reviewed and signed.    Pre-operative drops (antibiotic and NSAID) were prescribed. The patient understands to start these medications 3 days prior to surgery QID in the surgical eye.     The increased variability in IOL power prediction in patients after prior refractive surgery was reviewed with the Ms. Carta. The increased rate of secondary intervention such as follow-up refractive corneal surgery, piggyback  IOL, or IOL exchange was also reviewed.  These additional procedures may have additional fees for the pt.  The patient expresses an understanding of this and wishes to proceed.           Plan: toric IOL OD and catalys OS aim distance.  OD then OS  H/o hyperopic LASIK    **multiple allergies will likely need to send alternative drops (tobramycin ok. Hold acular.   PF ok )    08/15/2019     IOLm TK   OD: 0.86 @ 178  OS: 0.36 @ 133    IOLm K   OD: 0.56 @ 180  OS: 0.60 @ 120    topo  OD:0.79 @ 008  OS: 0.33 @ 025     TCRP:   OD: 1.0 @ 008  OS: 0.4 @ 178    08/29/2019 : here for repeat measurements  IOLm TK   OD: 0.71 @ 176  OS: 0.30@ 028    IOLm K   OD: 0.43 @ 180  OS: 0.19 @ 074    topo  OD: 0.85 @ 009  OS: 0.43 @ 006     TCRP:   OD: 1.2 @ 008  OS: 0.6 @ 179       08/02/2020  IOLm TK   OD: 1.00 @ 004  OS: 0.68 @ 015    IOLm K   OD: 0.73 @ 004  OS: 0.26 @ 017    topo  OD: 1.01 @ 005  OS: 0.28 @ 128     TCRP:   OD: 0.9 @ 5.3  OS: 0.3 @ 132.7    Chord mu:  OD: 0.24 mm  OS: 0.25 mm      Dry eye OU   OD>OS  Uses Xiidra BID- continue  PFAT Q 1-2 hours   WC/LH   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

## 2020-09-17 ENCOUNTER — Ambulatory Visit

## 2020-09-17 ENCOUNTER — Ambulatory Visit: Payer: Medicare Other

## 2020-09-17 NOTE — Anesthesia Preprocedure Evaluation (Addendum)
ANESTHESIA PRE-OPERATIVE EVALUATION    Patient Information    Name: Shelly Neal    MRN: [id number]    DOB: 05-Nov-1945    Age: 75 year old    Sex: female  Procedure(s):  T3 OP* CATARACT EXTRACTION WITH INTROCULAR LENS IMPLANT LEFT EYE      Pre-op Vitals:   There were no vitals taken for this visit.        Primary language spoken:  English    ROS/Medical History:           Special Considerations: Difficulty placing IV line in the past -Yes     General:  Can do light to moderate activities  Yes   Can do moderate to heavy activities  Yes      Cardiovascular:     Anesthesia History:  history of anesthetic complications ( Nausea and vomiting ),   Pulmonary:      Neuro/Psych:    Hematology/Oncology:       GI/Hepatic:   Infectious Disease:     Renal:   Endocrine/Other:  arthritis ( rheumatoid arthritis),   Hypothyroidism   Pregnancy History:   Pediatrics:         Pre Anesthesia Testing (PCC/CPC) notes/comments:    [facility] Test & records reviewed by [facility] Provider.                                     Physical Exam    Airway:  Inter-inciser distance > 4 cm  Prognanth Able    Mallampati: II  Neck ROM: full  TM distance: > 6 cm          Cardiovascular:  - cardiovascular exam normal         Pulmonary:  - pulmonary exam normal           Neuro/Neck/Skeletal/Skin:  - Neck/Neuro/Skeletal/Skin exam normal          Dental:  - normal exam    Abdominal:                                                                                                       Past Medical History:   Diagnosis Date   . Cataract    . Dry eye    . Hypothyroidism    . RA (rheumatoid arthritis) (CMS-HCC)      Past Surgical History:   Procedure Laterality Date   . CATARACT EXTRACTION Right 09/02/2020     Social History     Tobacco Use   . Smoking status: Never Smoker   . Smokeless tobacco: Never Used   Substance Use Topics   . Alcohol use: Never   . Drug use: Never       Current Outpatient Medications   Medication Sig Dispense Refill   . anterior blue eye kit USED AS  INSTRUCTED BY YOUR PHYSICIAN (KIT FOR RIGHT EYE) 1 kit 0   . Azelastine HCl 0.15 % SOLN 1 drop in each nostril     . budesonide-formoterol (SYMBICORT)  160-4.5 MCG/ACT inhaler Inhale 2 puffs by mouth.     . Carboxymethylcellulose Sodium (THERATEARS) 0.25 % SOLN Place 1 drop into both eyes daily.     . cetirizine (ZYRTEC) 10 MG tablet Take 10 mg by mouth daily.     . cycloSPORINE (RESTASIS) 0.05 % ophthalmic emulsion Place 1 drop into both eyes 2 times daily. 2 each 0   . fluorometholone (FML) 0.1 % ophthalmic suspension Place 1 drop into both eyes 4 times daily. 1 bottle 0   . fluticasone propionate (FLONASE) 50 MCG/ACT nasal spray 1 spray by Nasal route.     . fluticasone-salmeterol (WIXELA INHUB) 250-50 MCG/DOSE inhaler      . folic acid (FOLVITE) 1 MG tablet 1 mg by Oral route.     . methotrexate (TREXALL) 2.5 MG tablet As Directed     . Olopatadine HCl 0.7 % SOLN Place 1 drop into both eyes daily.     [place] Calcium 500 MG TABS 1 tablet     . prednisoLONE acetate (PRED FORTE) 1 % ophthalmic suspension Place 1 drop into right eye 4 times daily. Start 3 days prior to surgery 1 each 0   . prednisoLONE acetate (PRED FORTE) 1 % ophthalmic suspension Place 1 drop into right eye 4 times daily. 10 mL 0   . sertraline (ZOLOFT) 100 MG tablet Take 150 mg by mouth daily.     Marland Kitchen SYNTHROID 88 MCG tablet Take 1 tablet by mouth daily.     Marland Kitchen tobramycin (TOBREX) 0.3 % ophthalmic solution Place 1 drop into right eye every 4 hours. Start 3 days prior to surgery 1 each 0   . tobramycin (TOBREX) 0.3 % ophthalmic solution Place 1 drop into right eye 4 times daily. 5 mL 0   . tobramycin (TOBREX) 0.3 % ophthalmic solution Place 1 drop into right eye 4 times daily. Start 3 days prior to surgery 1 bottle 0   . XIIDRA 5 % SOLN        No current facility-administered medications for this visit.     Allergies   Allergen Reactions   . Cefaclor Hives     Other reaction(s): Pruritic rash (disorder)  Other reaction(s): Pruritic rash  (disorder)  No problems with amoxicillin/clavulanate - last taken October 2019  Other reaction(s): Pruritic rash (disorder)  Other reaction(s): Pruritic rash (disorder)  Other reaction(s): Pruritic rash (disorder)    Other reaction(s): Unknown  Other reaction(s): Pruritic rash (disorder)  Other reaction(s): Pruritic rash (disorder)  Other reaction(s): Pruritic rash (disorder)  Other reaction(s): Pruritic rash (disorder)  No problems with amoxicillin/clavulanate - last taken October 2019  Other reaction(s): Pruritic rash (disorder)  Other reaction(s): Pruritic rash (disorder)  No problems with amoxicillin/clavulanate - last taken October 2019  Other reaction(s): Pruritic rash (disorder)  Other reaction(s): Pruritic rash (disorder)  Other reaction(s): Pruritic rash (disorder)     . Ciprofloxacin Rash   . Diagnostic X-Ray Materials Rash   . Fd&C Blue #2 [place] Rash   . Hydromorphone Rash     pruritic rash to left arm - no shortness of breath  pruritic rash to left arm - no shortness of breath  pruritic rash to left arm - no shortness of breath  pruritic rash to left arm - no shortness of breath  pruritic rash to left arm - no shortness of breath    pruritic rash to left arm - no shortness of breath  pruritic rash to left arm - no shortness of breath  pruritic rash to left arm - no shortness of breath  pruritic rash to left arm - no shortness of breath  pruritic rash to left arm - no shortness of breath  pruritic rash to left arm - no shortness of breath  pruritic rash to left arm - no shortness of breath  pruritic rash to left arm - no shortness of breath  pruritic rash to left arm - no shortness of breath  pruritic rash to left arm - no shortness of breath   . Ibuprofen Hives and Rash     Aleve    Aleve  Aleve   . Meloxicam Rash   . Naproxen Rash   . Neomycin-Polymyxin B Gu Rash   . Polyethylene Glycol Hives     Other reaction(s): Pruritic rash (disorder)  Other reaction(s): Pruritic rash (disorder)  Other  reaction(s): Pruritic rash (disorder)  Other reaction(s): Pruritic rash (disorder)    Other reaction(s): Pruritic rash (disorder)  Other reaction(s): Pruritic rash (disorder)  Other reaction(s): Pruritic rash (disorder)  Other reaction(s): Pruritic rash (disorder)  Other reaction(s): Pruritic rash (disorder)  Other reaction(s): Pruritic rash (disorder)  Other reaction(s): Pruritic rash (disorder)  Other reaction(s): Pruritic rash (disorder)   . Pseudoephedrine Rash   . Sulfamethoxazole W-Trimethoprim Hives     Other reaction(s): Pruritic rash (disorder)  Other reaction(s): Pruritic rash (disorder)    Other reaction(s): Unknown  Other reaction(s): Pruritic rash (disorder)  Other reaction(s): Pruritic rash (disorder)  Other reaction(s): Pruritic rash (disorder)  Other reaction(s): Pruritic rash (disorder)  Other reaction(s): Pruritic rash (disorder)  Other reaction(s): Pruritic rash (disorder)  Other reaction(s): Pruritic rash (disorder)    Other reaction(s): Pruritic rash (disorder)  Other reaction(s): Pruritic rash (disorder)     . Gabapentin Nausea and Vomiting   . Guaifenesin Unspecified   . Homeopathic Products Other       Labs and Other Data  No results found for: NA, SODIUM, K, CL, CO2, BUN, CREAT, GLU, [place]  No results found for: AST, ALT, GGT, LDH, ALK, TP, ALB, TBILI, DBILI  No results found for: WBCCOUNT, RBC, HGB, HCT, MCV, MCHC, RDW, PLT, PLCTEL, MPVH, SEG, LYMPHS, MONOS, EOS, BASOS  No results found for: INR, PTT  No results found for: ARTPH, ARTPO2, ARTPCO2    Anesthesia Plan:  Risks and Benefits of Anesthesia  I personally examined the patient immediately prior to the anesthetic and reviewed the pertinent medical history, drug and allergy history, laboratory and imaging studies and consultations. I have determined that the patient has had adequate assessment and testing.    Anesthetic techniques, invasive monitors, anesthetic drugs for induction, maintenance and post-operative analgesia, risks and  alternatives have been explained to the patient and/or patient's representatives.    I have prescribed the anesthetic plan:         Planned anesthesia method: Monitored Anesthesia Care         ASA 2 (Mild systemic disease)     Potential anesthesia problems identified and risks including but not limited to the following were discussed with patient and/or patient's representative: Adverse or allergic drug reaction, Recall, Ocular injury, Nerve injury and Injury to brain, heart and other organs    Planned monitoring method: Routine monitoring    Informed Consent:  Anesthetic plan and risks discussed with Patient.

## 2020-09-23 ENCOUNTER — Encounter: Admission: RE | Disposition: A | Discharge status: Home Health | Payer: Self-pay | Attending: Ophthalmology

## 2020-09-23 ENCOUNTER — Ambulatory Visit (HOSPITAL_BASED_OUTPATIENT_CLINIC_OR_DEPARTMENT_OTHER)

## 2020-09-23 ENCOUNTER — Ambulatory Visit

## 2020-09-23 ENCOUNTER — Ambulatory Visit
Admission: RE | Admit: 2020-09-23 | Discharge: 2020-09-23 | Disposition: A | Discharge status: Home / Self Care | Attending: Ophthalmology | Admitting: Ophthalmology

## 2020-09-23 ENCOUNTER — Encounter: Payer: Self-pay | Admitting: Ophthalmology

## 2020-09-23 ENCOUNTER — Ambulatory Visit (HOSPITAL_BASED_OUTPATIENT_CLINIC_OR_DEPARTMENT_OTHER): Payer: Medicare Other

## 2020-09-23 ENCOUNTER — Ambulatory Visit
Admission: RE | Admit: 2020-09-23 | Discharge: 2020-09-23 | Disposition: A | Discharge status: Home / Self Care | Payer: Medicare Other | Attending: Ophthalmology | Admitting: Ophthalmology

## 2020-09-23 ENCOUNTER — Ambulatory Visit: Payer: Medicare Other

## 2020-09-23 DIAGNOSIS — H269 Unspecified cataract: Secondary | ICD-10-CM | POA: Insufficient documentation

## 2020-09-23 DIAGNOSIS — H2512 Age-related nuclear cataract, left eye: Secondary | ICD-10-CM | POA: Insufficient documentation

## 2020-09-23 DIAGNOSIS — M069 Rheumatoid arthritis, unspecified: Secondary | ICD-10-CM | POA: Insufficient documentation

## 2020-09-23 DIAGNOSIS — H527 Unspecified disorder of refraction: Secondary | ICD-10-CM

## 2020-09-23 DIAGNOSIS — Z7951 Long term (current) use of inhaled steroids: Secondary | ICD-10-CM | POA: Insufficient documentation

## 2020-09-23 DIAGNOSIS — H2513 Age-related nuclear cataract, bilateral: Secondary | ICD-10-CM

## 2020-09-23 DIAGNOSIS — E039 Hypothyroidism, unspecified: Secondary | ICD-10-CM | POA: Insufficient documentation

## 2020-09-23 DIAGNOSIS — Z79899 Other long term (current) drug therapy: Secondary | ICD-10-CM | POA: Insufficient documentation

## 2020-09-23 SURGERY — EXTRACTION, CATARACT, EXTRACAPSULAR, WITH IOL INSERTION
Anesthesia: Monitored Anesthesia Care (MAC) | Site: Eye | Laterality: Left | Wound class: Class I (Clean)

## 2020-09-23 MED ORDER — PHENYLEPHRINE HCL 2.5 % OP SOLN
1.0000 [drp] | OPHTHALMIC | Status: AC
Start: 2020-09-23 — End: 2020-09-23
  Administered 2020-09-23 (×4): 1 [drp] via OPHTHALMIC

## 2020-09-23 MED ORDER — SODIUM CHLORIDE 0.9 % IV SOLN
INTRAVENOUS | Status: DC | PRN
Start: 2020-09-23 — End: 2020-09-23

## 2020-09-23 MED ORDER — TOBRAMYCIN SULFATE 0.3 % OP SOLN
1.0000 [drp] | OPHTHALMIC | Status: AC
Start: 2020-09-23 — End: 2020-09-23
  Administered 2020-09-23 (×3): 1 [drp] via OPHTHALMIC

## 2020-09-23 MED ORDER — BSS IO SOLN - 500 ML (OPTIME)
INTRAOCULAR | Status: DC | PRN
Start: 2020-09-23 — End: 2020-09-23
  Administered 2020-09-23: 500 mL via INTRAOCULAR

## 2020-09-23 MED ORDER — TROPICAMIDE 1 % OP SOLN
OPHTHALMIC | Status: AC
Start: 2020-09-23 — End: 2020-09-23
  Filled 2020-09-23: qty 15

## 2020-09-23 MED ORDER — MIDAZOLAM HCL (PF) 2 MG/2ML IJ SOLN
INTRAMUSCULAR | Status: AC
Start: 2020-09-23 — End: 2020-09-23
  Filled 2020-09-23: qty 2

## 2020-09-23 MED ORDER — PREDNISOLONE ACETATE 1 % OP SUSP
1.0000 [drp] | Freq: Once | OPHTHALMIC | Status: AC
Start: 2020-09-23 — End: 2020-09-23
  Administered 2020-09-23 (×2): 1 [drp] via OPHTHALMIC

## 2020-09-23 MED ORDER — NALOXONE HCL 0.4 MG/ML IJ SOLN
0.2000 mg | Freq: Once | INTRAMUSCULAR | Status: DC | PRN
Start: 2020-09-23 — End: 2020-09-23

## 2020-09-23 MED ORDER — BSS IO SOLN
INTRAOCULAR | Status: DC | PRN
Start: 2020-09-23 — End: 2020-09-23
  Administered 2020-09-23 (×2): 5 mL

## 2020-09-23 MED ORDER — ACETAMINOPHEN 325 MG PO TABS
650.0000 mg | ORAL_TABLET | ORAL | Status: DC | PRN
Start: 2020-09-23 — End: 2020-09-23

## 2020-09-23 MED ORDER — EPINEPHRINE PF 1 MG/ML IJ SOLN
INTRAMUSCULAR | Status: DC | PRN
Start: 2020-09-23 — End: 2020-09-23
  Administered 2020-09-23: .8 mg

## 2020-09-23 MED ORDER — TETRACAINE HCL 0.5 % OP SOLN
OPHTHALMIC | Status: AC
Start: 2020-09-23 — End: 2020-09-23
  Filled 2020-09-23: qty 5

## 2020-09-23 MED ORDER — LIDOCAINE HCL (PF) 1 % IJ SOLN
INTRAMUSCULAR | Status: DC | PRN
Start: 2020-09-23 — End: 2020-09-23
  Administered 2020-09-23 (×2): 2 mL via INTRACAMERAL

## 2020-09-23 MED ORDER — TETRACAINE HCL 0.5 % OP SOLN
1.0000 [drp] | OPHTHALMIC | Status: AC
Start: 2020-09-23 — End: 2020-09-23
  Administered 2020-09-23 (×3): 1 [drp] via OPHTHALMIC

## 2020-09-23 MED ORDER — MIDAZOLAM HCL 2 MG/2ML IJ SOLN
INTRAMUSCULAR | Status: DC | PRN
Start: 2020-09-23 — End: 2020-09-23
  Administered 2020-09-23 (×3): 1 mg via INTRAVENOUS

## 2020-09-23 MED ORDER — SODIUM CHLORIDE 0.9 % IV SOLN
INTRAVENOUS | Status: DC
Start: 2020-09-23 — End: 2020-09-23

## 2020-09-23 MED ORDER — PREDNISONE PO
ORAL | Status: DC
Start: ? — End: 2022-03-19

## 2020-09-23 MED ORDER — SODIUM CHLORIDE 0.9 % IV SOLN
1000.0000 mL | INTRAVENOUS | Status: DC
Start: 2020-09-23 — End: 2020-09-23

## 2020-09-23 MED ORDER — SODIUM HYALURONATE 30 MG/ML IO SOLN (~~LOC~~)
INTRAOCULAR | Status: DC | PRN
Start: 2020-09-23 — End: 2020-09-23
  Administered 2020-09-23: .85 mL via INTRAOCULAR

## 2020-09-23 MED ORDER — SODIUM HYALURONATE 10 MG/ML IO SOLN
INTRAOCULAR | Status: DC | PRN
Start: 2020-09-23 — End: 2020-09-23
  Administered 2020-09-23 (×2): .85 mL via INTRAOCULAR

## 2020-09-23 MED ORDER — TROPICAMIDE 1 % OP SOLN
1.0000 [drp] | OPHTHALMIC | Status: AC
Start: 2020-09-23 — End: 2020-09-23
  Administered 2020-09-23 (×4): 1 [drp] via OPHTHALMIC

## 2020-09-23 MED ORDER — STERILE WATER FOR IRRIGATION IR SOLN
Status: DC | PRN
Start: 2020-09-23 — End: 2020-09-23
  Administered 2020-09-23 (×2): 500 mL

## 2020-09-23 MED ORDER — SODIUM HYALURONATE 30 MG/ML IO SOLN (~~LOC~~)
INTRAOCULAR | Status: DC | PRN
Start: 2020-09-23 — End: 2020-09-23
  Administered 2020-09-23 (×2): .85 mL via INTRAOCULAR

## 2020-09-23 MED ORDER — POVIDONE-IODINE 5 % OP SOLN
OPHTHALMIC | Status: DC | PRN
Start: 2020-09-23 — End: 2020-09-23
  Administered 2020-09-23 (×2): 5 [drp] via OPHTHALMIC

## 2020-09-23 MED ORDER — PHENYLEPHRINE HCL 2.5 % OP SOLN
OPHTHALMIC | Status: AC
Start: 2020-09-23 — End: 2020-09-23
  Filled 2020-09-23: qty 15

## 2020-09-23 MED ORDER — TROPICAMIDE 1 % OP SOLN
OPHTHALMIC | Status: AC
Start: 2020-09-23 — End: ?

## 2020-09-23 MED ORDER — LIDOCAINE HCL (PF) 1 % IJ SOLN
INTRAMUSCULAR | Status: DC | PRN
Start: 2020-09-23 — End: 2020-09-23
  Administered 2020-09-23: 2 mL via INTRACAMERAL

## 2020-09-23 MED ORDER — SODIUM CHLORIDE 0.9 % IV SOLN
1000.00 mL | INTRAVENOUS | Status: DC
Start: 2020-09-23 — End: 2020-09-23

## 2020-09-23 MED ORDER — TROPICAMIDE 1 % OP SOLN
1.00 [drp] | OPHTHALMIC | Status: AC
Start: 2020-09-23 — End: 2020-09-23
  Administered 2020-09-23 (×3): 1 [drp] via OPHTHALMIC

## 2020-09-23 MED ORDER — PREDNISONE PO: ORAL | Status: AC

## 2020-09-23 MED ORDER — NALOXONE HCL 0.4 MG/ML IJ SOLN
0.20 mg | Freq: Once | INTRAMUSCULAR | Status: DC | PRN
Start: 2020-09-23 — End: 2020-09-23

## 2020-09-23 MED ORDER — TETRACAINE HCL 0.5 % OP SOLN
OPHTHALMIC | Status: AC
Start: 2020-09-23 — End: ?

## 2020-09-23 MED ORDER — SODIUM HYALURONATE 10 MG/ML IO SOLN
INTRAOCULAR | Status: DC | PRN
Start: 2020-09-23 — End: 2020-09-23
  Administered 2020-09-23: .85 mL via INTRAOCULAR

## 2020-09-23 MED ORDER — ACETAMINOPHEN 325 MG PO TABS
650.00 mg | ORAL_TABLET | ORAL | Status: DC | PRN
Start: 2020-09-23 — End: 2020-09-23

## 2020-09-23 MED ORDER — TOBRAMYCIN SULFATE 0.3 % OP SOLN
1.00 [drp] | OPHTHALMIC | Status: AC
Start: 2020-09-23 — End: 2020-09-23
  Administered 2020-09-23 (×3): 1 [drp] via OPHTHALMIC

## 2020-09-23 MED ORDER — PHENYLEPHRINE HCL 2.5 % OP SOLN
1.00 [drp] | OPHTHALMIC | Status: AC
Start: 2020-09-23 — End: 2020-09-23
  Administered 2020-09-23 (×3): 1 [drp] via OPHTHALMIC

## 2020-09-23 MED ORDER — MIDAZOLAM HCL 2 MG/2ML IJ SOLN
INTRAMUSCULAR | Status: DC | PRN
Start: 2020-09-23 — End: 2020-09-23
  Administered 2020-09-23 (×2): 1 mg via INTRAVENOUS

## 2020-09-23 MED ORDER — MIDAZOLAM HCL (PF) 2 MG/2ML IJ SOLN
INTRAMUSCULAR | Status: AC
Start: 2020-09-23 — End: ?

## 2020-09-23 MED ORDER — BSS IO SOLN
INTRAOCULAR | Status: DC | PRN
Start: 2020-09-23 — End: 2020-09-23
  Administered 2020-09-23: 5 mL

## 2020-09-23 MED ORDER — POVIDONE-IODINE 5 % OP SOLN
OPHTHALMIC | Status: DC | PRN
Start: 2020-09-23 — End: 2020-09-23
  Administered 2020-09-23: 5 [drp] via OPHTHALMIC

## 2020-09-23 MED ORDER — TETRACAINE HCL 0.5 % OP SOLN
1.00 [drp] | OPHTHALMIC | Status: AC
Start: 2020-09-23 — End: 2020-09-23
  Administered 2020-09-23 (×3): 1 [drp] via OPHTHALMIC

## 2020-09-23 MED ORDER — PREDNISOLONE ACETATE 1 % OP SUSP
1.00 [drp] | Freq: Once | OPHTHALMIC | Status: AC
Start: 2020-09-23 — End: 2020-09-23
  Administered 2020-09-23: 1 [drp] via OPHTHALMIC

## 2020-09-23 MED ORDER — STERILE WATER FOR IRRIGATION IR SOLN
Status: DC | PRN
Start: 2020-09-23 — End: 2020-09-23
  Administered 2020-09-23: 500 mL

## 2020-09-23 MED ORDER — PHENYLEPHRINE HCL 2.5 % OP SOLN
OPHTHALMIC | Status: AC
Start: 2020-09-23 — End: ?

## 2020-09-23 SURGICAL SUPPLY — 1 items: Lens IOL Tecnis Simplicity Eyhance Monofocal 22.5D (Lens) ×2 IMPLANT

## 2020-09-23 SURGICAL SUPPLY — 1 items: Lens IOL Tecnis Simplicity Eyhance Monofocal 22.5D (Lens) ×1 IMPLANT

## 2020-09-23 NOTE — H&P (Signed)
OUTPATIENT PROCEDURE H&P    09/23/2020    HPI/Reason for surgery: Cataract, unspecified cataract type, unspecified laterality [H26.9]   Surgeon: Adriana Reams, MD  Department: Ophthalmology     Review of Systems:  No F/C/N/V/SOB/CP/Headache/Diarrhea/Arthalgias    Past medical history:  Past Medical History:   Diagnosis Date   . Cataract    . Dry eye    . Hypothyroidism    . RA (rheumatoid arthritis) (CMS-HCC)      Past Surgical History:   Procedure Laterality Date   . CATARACT EXTRACTION Right 09/02/2020     No family history on file.    Social history:  Social History     Socioeconomic History   . Marital status: Married     Spouse name: Not on file   . Number of children: Not on file   . Years of education: Not on file   . Highest education level: Not on file   Occupational History   . Not on file   Tobacco Use   . Smoking status: Never Smoker   . Smokeless tobacco: Never Used   Substance and Sexual Activity   . Alcohol use: Never   . Drug use: Never   . Sexual activity: Not on file   Other Topics Concern   . Not on file   Social History Narrative   . Not on file     Social Determinants of Health     Financial Resource Strain:    . Difficulty of Paying Living Expenses: Not on file   Food Insecurity:    . Worried About Charity fundraiser in the Last Year: Not on file   . Ran Out of Food in the Last Year: Not on file   Transportation Needs:    . Lack of Transportation (Medical): Not on file   . Lack of Transportation (Non-Medical): Not on file   Physical Activity:    . Days of Exercise per Week: Not on file   . Minutes of Exercise per Session: Not on file   Stress:    . Feeling of Stress : Not on file   Social Connections:    . Frequency of Communication with Friends and Family: Not on file   . Frequency of Social Gatherings with Friends and Family: Not on file   . Attends Religious Services: Not on file   . Active Member of Clubs or Organizations: Not on file   . Attends Archivist Meetings: Not on file    . Marital Status: Not on file   Intimate Partner Violence:    . Fear of Current or Ex-Partner: Not on file   . Emotionally Abused: Not on file   . Physically Abused: Not on file   . Sexually Abused: Not on file   Housing Stability:    . Unable to Pay for Housing in the Last Year: Not on file   . Number of Places Lived in the Last Year: Not on file   . Unstable Housing in the Last Year: Not on file     No smoking or drug abuse    Medications:  No current facility-administered medications on file prior to encounter.     Current Outpatient Medications on File Prior to Encounter   Medication Sig Dispense Refill   . anterior blue eye kit USED AS INSTRUCTED BY YOUR PHYSICIAN (KIT FOR RIGHT EYE) 1 kit 0   . Azelastine HCl 0.15 % SOLN 1 drop in each nostril     .  budesonide-formoterol (SYMBICORT) 160-4.5 MCG/ACT inhaler Inhale 2 puffs by mouth.     . Carboxymethylcellulose Sodium (THERATEARS) 0.25 % SOLN Place 1 drop into both eyes daily.     . cetirizine (ZYRTEC) 10 MG tablet Take 10 mg by mouth daily.     . cycloSPORINE (RESTASIS) 0.05 % ophthalmic emulsion Place 1 drop into both eyes 2 times daily. 2 each 0   . fluorometholone (FML) 0.1 % ophthalmic suspension Place 1 drop into both eyes 4 times daily. 1 bottle 0   . fluticasone propionate (FLONASE) 50 MCG/ACT nasal spray 1 spray by Nasal route.     . fluticasone-salmeterol (WIXELA INHUB) 250-50 MCG/DOSE inhaler      . folic acid (FOLVITE) 1 MG tablet 1 mg by Oral route.     . methotrexate (TREXALL) 2.5 MG tablet As Directed     . Olopatadine HCl 0.7 % SOLN Place 1 drop into both eyes daily.     [place] Calcium 500 MG TABS 1 tablet     . prednisoLONE acetate (PRED FORTE) 1 % ophthalmic suspension Place 1 drop into right eye 4 times daily. Start 3 days prior to surgery 1 each 0   . prednisoLONE acetate (PRED FORTE) 1 % ophthalmic suspension Place 1 drop into right eye 4 times daily. 10 mL 0   . sertraline (ZOLOFT) 100 MG tablet Take 150 mg by mouth daily.     Marland Kitchen  SYNTHROID 88 MCG tablet Take 1 tablet by mouth daily.     Marland Kitchen tobramycin (TOBREX) 0.3 % ophthalmic solution Place 1 drop into right eye every 4 hours. Start 3 days prior to surgery 1 each 0   . tobramycin (TOBREX) 0.3 % ophthalmic solution Place 1 drop into right eye 4 times daily. 5 mL 0   . tobramycin (TOBREX) 0.3 % ophthalmic solution Place 1 drop into right eye 4 times daily. Start 3 days prior to surgery 1 bottle 0   . XIIDRA 5 % SOLN          Allergies:  Allergies   Allergen Reactions   . Cefaclor Hives     Other reaction(s): Pruritic rash (disorder)  Other reaction(s): Pruritic rash (disorder)  No problems with amoxicillin/clavulanate - last taken October 2019  Other reaction(s): Pruritic rash (disorder)  Other reaction(s): Pruritic rash (disorder)  Other reaction(s): Pruritic rash (disorder)    Other reaction(s): Unknown  Other reaction(s): Pruritic rash (disorder)  Other reaction(s): Pruritic rash (disorder)  Other reaction(s): Pruritic rash (disorder)  Other reaction(s): Pruritic rash (disorder)  No problems with amoxicillin/clavulanate - last taken October 2019  Other reaction(s): Pruritic rash (disorder)  Other reaction(s): Pruritic rash (disorder)  No problems with amoxicillin/clavulanate - last taken October 2019  Other reaction(s): Pruritic rash (disorder)  Other reaction(s): Pruritic rash (disorder)  Other reaction(s): Pruritic rash (disorder)     . Ciprofloxacin Rash   . Diagnostic X-Ray Materials Rash   . Fd&C Blue #2 [place] Rash   . Hydromorphone Rash     pruritic rash to left arm - no shortness of breath  pruritic rash to left arm - no shortness of breath  pruritic rash to left arm - no shortness of breath  pruritic rash to left arm - no shortness of breath  pruritic rash to left arm - no shortness of breath    pruritic rash to left arm - no shortness of breath  pruritic rash to left arm - no shortness of breath  pruritic rash to left  arm - no shortness of breath  pruritic rash to left arm -  no shortness of breath  pruritic rash to left arm - no shortness of breath  pruritic rash to left arm - no shortness of breath  pruritic rash to left arm - no shortness of breath  pruritic rash to left arm - no shortness of breath  pruritic rash to left arm - no shortness of breath  pruritic rash to left arm - no shortness of breath   . Ibuprofen Hives and Rash     Aleve    Aleve  Aleve   . Meloxicam Rash   . Naproxen Rash   . Neomycin-Polymyxin B Gu Rash   . Polyethylene Glycol Hives     Other reaction(s): Pruritic rash (disorder)  Other reaction(s): Pruritic rash (disorder)  Other reaction(s): Pruritic rash (disorder)  Other reaction(s): Pruritic rash (disorder)    Other reaction(s): Pruritic rash (disorder)  Other reaction(s): Pruritic rash (disorder)  Other reaction(s): Pruritic rash (disorder)  Other reaction(s): Pruritic rash (disorder)  Other reaction(s): Pruritic rash (disorder)  Other reaction(s): Pruritic rash (disorder)  Other reaction(s): Pruritic rash (disorder)  Other reaction(s): Pruritic rash (disorder)   . Pseudoephedrine Rash   . Sulfamethoxazole W-Trimethoprim Hives     Other reaction(s): Pruritic rash (disorder)  Other reaction(s): Pruritic rash (disorder)    Other reaction(s): Unknown  Other reaction(s): Pruritic rash (disorder)  Other reaction(s): Pruritic rash (disorder)  Other reaction(s): Pruritic rash (disorder)  Other reaction(s): Pruritic rash (disorder)  Other reaction(s): Pruritic rash (disorder)  Other reaction(s): Pruritic rash (disorder)  Other reaction(s): Pruritic rash (disorder)    Other reaction(s): Pruritic rash (disorder)  Other reaction(s): Pruritic rash (disorder)     . Gabapentin Nausea and Vomiting   . Guaifenesin Unspecified   . Homeopathic Products Other         Pre-op Physical Exam:    Vitals:      (see nurse's notes)    ? HEENT: NCAT, EOMI  ? Cardiovascular: RRR  ? Respiratory: CTAB  ? Abdomen: soft, NT, ND  ? Neurologic: A&Ox 3, Normal  ? Extremities:  Normal    Assessment & Plan: Cataract, unspecified cataract type, unspecified laterality [H26.9]     Proceed with surgery as planned under [place] anesthesia.    We rediscussed the risks, benefits, and alternatives; we also explained there is no guarantee or warrantee to the recommended treatment. A more detailed list of risks can be found on informed written consent form that is signed & complete.

## 2020-09-23 NOTE — Op Note (Signed)
Date of Surgery: 09/23/2020    Preoperative Diagnosis(es)  Visually significant age related nuclear sclerotic cataract of the Left eye    Postoperative Diagnosis(es)  Same as above    Operation Performed  1. Cataract extraction with placement of intraocular lens,  Left eye  2. Refractive component, laser, left eye    Surgeon(s): Adriana Reams, MD  Assistant(s):  Annie Main, MD Resident  Anesthesia: Topical, MAC.  Estimated Blood Loss: None.  Drains: None.  IV Fluids: KVO  Specimens/Lesions Removed: Cataract  Disposition of Each Specimen: Discarded.  Complications: None.  Condition on Discharge: Stable.    Lens Type and Power:   Implant Name Type Inv. Item Serial No. Manufacturer Lot No. LRB No. Used Action   LENS IOL TECNIS SIMPLICITY EYHANCE MONOFOCAL 22.5D - [id number] Lens Lens IOL Tecnis Simplicity Eyhance Monofocal 22.5D [id number] [place]  Left 1 Implanted       Indications: Loss of visual function attributable to cataract formation.  ORA was used to find final astigmatism.     Procedure in Detail    After suitable preoperative medication, and confirmation of informed consent and current health status, the patient was brought to the laser suite, where a preliminary time-out was completed to confirm the correct patient, surgical site, and procedure to be performed.  The surgeon confirmed the planned laser treatment.  .  A new patient interface was opened and good vacuum suction was achieved. The patient was positioned under the laser and OCT imaging scans were confirmed prior to laser application. The femtosecond laser was then utilized to perform the following:Capsulotomy and Femtofragmentation. The procedure was completed without complication and the patient was transferred to the operating suite.    Additional topical anesthesia was administered and the patient's eye was prepped with Betadine, and draped with an incise drape cut to cover the lashes and meibomian glands when  the lid speculum was placed. A surgeon's time out was performed to re-affirm the correct patient, surgical site, and surgery to be performed. A 15 degree blade was used to make an paracentesis incision, through which the anterior chamber was instilled with 1% preservative-free lidocaine, followed by a dispersive viscoelastic.  A keratome was used to create the main incision. The Rhein cystotome was used to remove the laser capsulorrhexis, followed by hydrodissection with a Chang cannula. The pre-softened nucleus was then removed by phacoemulsification in the posterior chamber.  Any epinucleus was then aspirated by the phaco tip, followed by removing the residual cortex thoroughly using irrigation and aspiration.  The capsular bag was inflated with a cohesive viscoelastic agent.  The astigmatic incisions were bluntly dissected opening using a Sinskey hook.  The ORA intraoperative aberrometer was then used to verify aphakic IOL power.  The IOL was then injected in the capsular bag without complication, positioning the trailing haptic with the injector tip or Lester hook.  The lens was noted to be well centered.  The remaining viscoelastic was then removed from the chamber and the capsular bag using irrigation and aspiration.  The incision and paracentesis stroma were hydrated. The intraocular pressure was elevated with BSS and the incisions carefully tested for water-tight closure. The IOP was then lowered and the patient's vision grossly verified. The speculum was removed and antibiotics drops were placed in the eye.  A clear shield was placed on the patient's operative eye. The patient left the operating room in satisfactory condition, having tolerated the procedure well.

## 2020-09-23 NOTE — Progress Notes (Signed)
s/p CEIOL OS POD#1 (Eyhance 22.0 w/ Catalys/ORA for distance in setting of prior hyperopic LASIK)    doing well  IOP OK  On tobrex  No acular    Patient recovery is progressing as expected. Written medication (steroid/antibiotic/NSAID)s and post-op instructions were reviewed. The patient was reminded to call immediately if any deterioration in vision or new onset of symptoms occurs, emphasizing that the patient is postoperative and needs to be seen as an emergency.    Follow up as scheduled.   Check MRX (distance and near)    F/u with Dr. Marshall/Storch in 2-3 weeks for diagnostic MRx and DFE  If stable, f/u with Dr. Alveta Heimlich PRN  If stable, f/u with Dr.Bahri as scheduled           s/p CEIOL OD POD#1 (09/02/20) - eyhance toric, aim plano in the setting of hyperopic LASIK     doing well  IOP 27 - monitor     Patient recovery is progressing as expected. Written medication (steroid/antibiotic/NSAID)s and post-op instructions were reviewed. The patient was reminded to call immediately if any deterioration in vision or new onset of symptoms occurs, emphasizing that the patient is postoperative and needs to be seen as an emergency.    Follow up as scheduled 09/23/20    I reviewed and confirmed all history components, including the HPI; and any other elements performed by the technician. I performed the key and critical portions of the exam. The final examination findings, image interpretations, and plan as documented in the record represent my personal judgment and conclusions.    Suzan Nailer, MD   Cornea and Refractive Surgery Fellow                PRIOR TO SURGERY:  New patient referred by Dr. Ranae Pila   First seen 08/15/2019     Dr. Charmaine Downs- Rheumatologist        [place] 08/02/2020:    She has started getting infusions for her RA  She states her levels are normal now and she overall feels her health has improved  Uses Xiidra BID - continue  Consider Restasis after surgery    Nuclear sclerosis OU   Visually significant cataracts  OU--> also with significant dry eyes  DED improved but still affecting vision    Visually bothered by: glare night driving, difficulty with near vision, does needle point and is having trouble with this activity.  Gares to 20/50 and 20/40-2    RA under control x > 6 mo per pt on increased systemic tx.     Visual goal: distance and wear OTC reading glasses     Factors:   History of hyperopic LASIK OU  Dry eyes in setting of RA     Visual disturbance from cataracts is consistent with some or all of the patient's functional impairment. The risks, benefits, alternatives and possible complications of cataract surgery were discussed in detail. The patient understands that it is not possible to anticipate all possible outcomes or complications from the surgery. The patient also understands that no guarantees can be made regarding the success of the planned surgery. The options for different IOLs were discussed and explained. The patient understands that a change in glasses will not improve the visual function adequately. Given the examination findings, there is a reasonable expectation that the proposed surgery will improve the visual/functional status of the patient. The patient wishes to proceed with surgery. A consent for the proposed surgery was reviewed and signed by the patient.  The patient was evaluated for potential benefit from a Toric IOL. The alternatives have been presented to the patient. The elective nature of these IOLs and the lack of insurance coverage for this component of the surgery were reviewed in detail. The out-of-pocket cost for the possible IOL was explained. The patient expresses an understanding of this out-of-pocket expense and wishes to consider this option. The need for possible further interventions, including laser vision correction, IOL rotation, corneal astigmatic incisions, and IOL exchange , were reviewed with the patient. The patient also understands that the goal is not to  eliminate all of the pre-existing astigmatism, however, de-bulk it. The patient understands that the expectation is not for perfect vision at any or all distances and that spectacles may be needed for some or all activities. The supplemental consent for lifestyle IOLs were reviewed and signed.    Pre-operative drops (antibiotic and NSAID) were prescribed. The patient understands to start these medications 3 days prior to surgery QID in the surgical eye.     The increased variability in IOL power prediction in patients after prior refractive surgery was reviewed with the Ms. Alejos. The increased rate of secondary intervention such as follow-up refractive corneal surgery, piggyback IOL, or IOL exchange was also reviewed.  These additional procedures may have additional fees for the pt.  The patient expresses an understanding of this and wishes to proceed.           Plan: toric IOL OD and catalys OS aim distance.  OD then OS  H/o hyperopic LASIK    **multiple allergies will likely need to send alternative drops (tobramycin ok. Hold acular.   PF ok )    08/15/2019     IOLm TK   OD: 0.86 @ 178  OS: 0.36 @ 133    IOLm K   OD: 0.56 @ 180  OS: 0.60 @ 120    topo  OD:0.79 @ 008  OS: 0.33 @ 025     TCRP:   OD: 1.0 @ 008  OS: 0.4 @ 178    08/29/2019 : here for repeat measurements  IOLm TK   OD: 0.71 @ 176  OS: 0.30@ 028    IOLm K   OD: 0.43 @ 180  OS: 0.19 @ 074    topo  OD: 0.85 @ 009  OS: 0.43 @ 006     TCRP:   OD: 1.2 @ 008  OS: 0.6 @ 179       08/02/2020  IOLm TK   OD: 1.00 @ 004  OS: 0.68 @ 015    IOLm K   OD: 0.73 @ 004  OS: 0.26 @ 017    topo  OD: 1.01 @ 005  OS: 0.28 @ 128     TCRP:   OD: 0.9 @ 5.3  OS: 0.3 @ 132.7    Chord mu:  OD: 0.24 mm  OS: 0.25 mm      Dry eye OU   OD>OS  Uses Xiidra BID- continue  PFAT Q 1-2 hours   WC/LH   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

## 2020-09-23 NOTE — Anesthesia Postprocedure Evaluation (Signed)
Anesthesia Post Note    Patient: Shelly Neal    Procedure(s) Performed: Procedure(s):  T3 OP* CATARACT EXTRACTION WITH INTROCULAR LENS IMPLANT LEFT EYE      Final anesthesia type: Monitored Anesthesia Care    Patient location: PACU    Post anesthesia pain: adequate analgesia    Mental status: awake, alert  and oriented    Airway Patent: Yes    Last Vitals:    Vitals Value Taken Time   BP 117/55 09/23/20 1115   Temp 36.3 C 09/23/20 1115   Pulse 74 09/23/20 1115   Resp 16 09/23/20 1115   SpO2 94 % 09/23/20 1115        Temperature >35.5: Yes    Post vital signs: stable    Hydration: adequate    N/V:no    Plan of care per primary team.

## 2020-09-23 NOTE — Discharge Instructions (Signed)
Monitored Anesthesia Care, Care After  These instructions provide you with information about caring for yourself after your procedure. Your health care provider may also give you more specific instructions. Your treatment has been planned according to current medical practices, but problems sometimes occur. Call your health care provider if you have any problems or questions after your procedure.  What can I expect after the procedure?  After your procedure, you may:  Feel sleepy for several hours.  Feel clumsy and have poor balance for several hours.  Feel forgetful about what happened after the procedure.  Have poor judgment for several hours.  Feel nauseous or vomit.  Have a sore throat if you had a breathing tube during the procedure.  Follow these instructions at home:  For at least 24 hours after the procedure:         Have a responsible adult stay with you. It is important to have someone help care for you until you are awake and alert.  Rest as needed.  Do not:  Participate in activities in which you could fall or become injured.  Drive.  Use heavy machinery.  Drink alcohol.  Take sleeping pills or medicines that cause drowsiness.  Make important decisions or sign legal documents.  Take care of children on your own.  Eating and drinking  Follow the diet that is recommended by your health care provider.  If you vomit, drink water, juice, or soup when you can drink without vomiting.  Make sure you have little or no nausea before eating solid foods.  General instructions  Take over-the-counter and prescription medicines only as told by your health care provider.  If you have sleep apnea, surgery and certain medicines can increase your risk for breathing problems. Follow instructions from your health care provider about wearing your sleep device:  Anytime you are sleeping, including during daytime naps.  While taking prescription pain medicines, sleeping medicines, or medicines that make you drowsy.  If you  smoke, do not smoke without supervision.  Keep all follow-up visits as told by your health care provider. This is important.  Contact a health care provider if:  You keep feeling nauseous or you keep vomiting.  You feel light-headed.  You develop a rash.  You have a fever.  Get help right away if:  You have trouble breathing.  Summary  For several hours after your procedure, you may feel sleepy and have poor judgment.  Have a responsible adult stay with you for at least 24 hours or until you are awake and alert.  This information is not intended to replace advice given to you by your health care provider. Make sure you discuss any questions you have with your health care provider.  Document Released: 12/22/2015 Document Revised: 04/16/2017 Document Reviewed: 12/22/2015  [employer]vier Interactive Patient Education  2019 [employer]

## 2020-09-24 ENCOUNTER — Ambulatory Visit: Attending: Ophthalmology | Admitting: Ophthalmology

## 2020-09-24 ENCOUNTER — Encounter: Payer: Self-pay | Admitting: Ophthalmology

## 2020-09-24 ENCOUNTER — Ambulatory Visit: Payer: Medicare Other | Attending: Ophthalmology | Admitting: Ophthalmology

## 2020-09-24 DIAGNOSIS — H269 Unspecified cataract: Secondary | ICD-10-CM | POA: Insufficient documentation

## 2020-10-14 ENCOUNTER — Telehealth: Payer: Self-pay | Admitting: Orthopaedic Surgery

## 2020-10-14 NOTE — Telephone Encounter (Signed)
SPOKE TO PATIENT SHE WANTS TO CANCEL SURGERY ORDER FOR DR. KAPLAN THE LITTLE BUMB SHE HSD WENT AWAY

## 2020-10-18 ENCOUNTER — Encounter: Payer: Self-pay | Admitting: Optometrist

## 2020-10-18 ENCOUNTER — Ambulatory Visit: Attending: Optometrist | Admitting: Optometrist

## 2020-10-18 ENCOUNTER — Ambulatory Visit: Payer: Medicare Other | Attending: Optometrist | Admitting: Optometrist

## 2020-10-18 DIAGNOSIS — Z961 Presence of intraocular lens: Secondary | ICD-10-CM

## 2020-10-18 NOTE — Progress Notes (Signed)
s/p CEIOL OS (Eyhance 22.0 w/ Catalys/ORA for distance in setting of prior hyperopic LASIK)    s/p CEIOL OD (09/02/20) - eyhance toric, aim plano in the setting of hyperopic LASIK       doing well  IOP OK  On tobrex  No acular    Happy with vision     Patient recovery is progressing as expected. Written medication (steroid/antibiotic/NSAID)s and post-op instructions were reviewed. The patient was reminded to call immediately if any deterioration in vision or new onset of symptoms occurs, emphasizing that the patient is postoperative and needs to be seen as an emergency.    Follow up as scheduled.   Check MRX (distance and near)     f/u with Dr. Alveta Heimlich PRN   f/u with Dr.Bahri as scheduled     I performed the above HPI. I reviewed and confirmed the techs review of systems, past histories, and readings.    Nyra Capes, OD               PRIOR TO SURGERY:  New patient referred by Dr. Ranae Pila   First seen 08/15/2019     Dr. Charmaine Downs- Rheumatologist        [place] 08/02/2020:    She has started getting infusions for her RA  She states her levels are normal now and she overall feels her health has improved  Uses Xiidra BID - continue  Consider Restasis after surgery    Nuclear sclerosis OU   Visually significant cataracts OU--> also with significant dry eyes  DED improved but still affecting vision    Visually bothered by: glare night driving, difficulty with near vision, does needle point and is having trouble with this activity.  Gares to 20/50 and 20/40-2    RA under control x > 6 mo per pt on increased systemic tx.     Visual goal: distance and wear OTC reading glasses     Factors:   History of hyperopic LASIK OU  Dry eyes in setting of RA     Visual disturbance from cataracts is consistent with some or all of the patient's functional impairment. The risks, benefits, alternatives and possible complications of cataract surgery were discussed in detail. The patient understands that it is not possible to anticipate all  possible outcomes or complications from the surgery. The patient also understands that no guarantees can be made regarding the success of the planned surgery. The options for different IOLs were discussed and explained. The patient understands that a change in glasses will not improve the visual function adequately. Given the examination findings, there is a reasonable expectation that the proposed surgery will improve the visual/functional status of the patient. The patient wishes to proceed with surgery. A consent for the proposed surgery was reviewed and signed by the patient.     The patient was evaluated for potential benefit from a Toric IOL. The alternatives have been presented to the patient. The elective nature of these IOLs and the lack of insurance coverage for this component of the surgery were reviewed in detail. The out-of-pocket cost for the possible IOL was explained. The patient expresses an understanding of this out-of-pocket expense and wishes to consider this option. The need for possible further interventions, including laser vision correction, IOL rotation, corneal astigmatic incisions, and IOL exchange , were reviewed with the patient. The patient also understands that the goal is not to eliminate all of the pre-existing astigmatism, however, de-bulk it. The patient understands that the expectation is  not for perfect vision at any or all distances and that spectacles may be needed for some or all activities. The supplemental consent for lifestyle IOLs were reviewed and signed.    Pre-operative drops (antibiotic and NSAID) were prescribed. The patient understands to start these medications 3 days prior to surgery QID in the surgical eye.     The increased variability in IOL power prediction in patients after prior refractive surgery was reviewed with the Ms. [place]umenstock. The increased rate of secondary intervention such as follow-up refractive corneal surgery, piggyback IOL, or IOL exchange was  also reviewed.  These additional procedures may have additional fees for the pt.  The patient expresses an understanding of this and wishes to proceed.           Plan: toric IOL OD and catalys OS aim distance.  OD then OS  H/o hyperopic LASIK    **multiple allergies will likely need to send alternative drops (tobramycin ok. Hold acular.   PF ok )    08/15/2019     IOLm TK   OD: 0.86 @ 178  OS: 0.36 @ 133    IOLm K   OD: 0.56 @ 180  OS: 0.60 @ 120    topo  OD:0.79 @ 008  OS: 0.33 @ 025     TCRP:   OD: 1.0 @ 008  OS: 0.4 @ 178    08/29/2019 : here for repeat measurements  IOLm TK   OD: 0.71 @ 176  OS: 0.30@ 028    IOLm K   OD: 0.43 @ 180  OS: 0.19 @ 074    topo  OD: 0.85 @ 009  OS: 0.43 @ 006     TCRP:   OD: 1.2 @ 008  OS: 0.6 @ 179       08/02/2020  IOLm TK   OD: 1.00 @ 004  OS: 0.68 @ 015    IOLm K   OD: 0.73 @ 004  OS: 0.26 @ 017    topo  OD: 1.01 @ 005  OS: 0.28 @ 128     TCRP:   OD: 0.9 @ 5.3  OS: 0.3 @ 132.7    Chord mu:  OD: 0.24 mm  OS: 0.25 mm      Dry eye OU   OD>OS  Uses Xiidra BID- continue  PFAT Q 1-2 hours   WC/LH   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

## 2021-12-24 ENCOUNTER — Inpatient Hospital Stay: Admit: 2021-12-24 | Discharge: 2021-12-24 | Disposition: A | Discharge status: Home Health | Payer: Self-pay

## 2021-12-30 ENCOUNTER — Telehealth: Payer: Self-pay | Admitting: Orthopaedic Surgery

## 2021-12-30 NOTE — Telephone Encounter (Signed)
Patient is calling to see if Dr Elizebeth Koller with  [facility]     Ok to call back  Leave a detailed message.

## 2021-12-30 NOTE — Telephone Encounter (Signed)
Patient is calling to see if Dr Learned with  Treat Osteomyelitis     Ok to call back  Leave a detailed message.

## 2021-12-31 NOTE — Telephone Encounter (Signed)
Patient called back again- Pam gone home for the day.    Patient will call back in the A.M    If calling patient back  Fisher to leave a detailed message.

## 2021-12-31 NOTE — Telephone Encounter (Signed)
Called the patient, no answer. Left a message asking the patient to please return my call. We would need to gather more information in order to answer her question.

## 2021-12-31 NOTE — Telephone Encounter (Signed)
Called the patient, no answer. Left a message asking the patient to please return my call. We would need to gather more information in order to answer her question.

## 2021-12-31 NOTE — Telephone Encounter (Signed)
Patient called back again- Allison David gone home for the day.    Patient will call back in the A.M    If calling patient back  Ok to leave a detailed message.

## 2022-01-01 NOTE — Telephone Encounter (Signed)
Fyi: transferred patient to nurse.

## 2022-01-01 NOTE — Telephone Encounter (Signed)
Spoke with patient, she would like to know if she could be seen by Dr. Elizebeth Koller for her osteomyelitis of the pelvis. Patient reports she was seen at [facility] by Dr. 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 She reports he "removed a large portion of bone". She states her symptoms have come back and she would like to be seen in [facility] for this issue. She has been experiencing pain and has low blood platelet level. She does not know the value but she will try and send recent medical records for reference. She also reports she had a recent low back MRI but she does not know if this included pelvic area. She has a CD of imaging she can bring for reference as well.

## 2022-01-01 NOTE — Telephone Encounter (Signed)
Spoke with patient, she would like to know if she could be seen by Dr. Learned for her osteomyelitis of the pelvis. Patient reports she was seen at [facility] by Dr. 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 She reports he "removed a large portion of bone". She states her symptoms have come back and she would like to be seen in [place] for this issue. She has been experiencing pain and has low blood platelet level. She does not know the value but she will try and send recent medical records for reference. She also reports she had a recent low back MRI but she does not know if this included pelvic area. She has a CD of imaging she can bring for reference as well.

## 2022-01-01 NOTE — Telephone Encounter (Signed)
Spoke with the patient, she will call the office back. She was currently at a doctors appointment. Offered the patient an appointment with Dr. Learned for 05/08 she will let us know.

## 2022-01-01 NOTE — Telephone Encounter (Signed)
Spoke with the patient, she will call the office back. She was currently at a doctors appointment. Offered the patient an appointment with Dr. Elizebeth Koller for 05/08 she will let us know.

## 2022-01-02 NOTE — Telephone Encounter (Signed)
Spoke with patient, she states she is not able to make it to may 8 appointment as she will be travelling and not returning until June. Offered next available appointment in June. Assisted to schedule. She will bring by her medical records and imaging.

## 2022-01-02 NOTE — Telephone Encounter (Signed)
Called transferred to [facility]

## 2022-01-02 NOTE — Telephone Encounter (Signed)
Called transferred to [facility]

## 2022-01-02 NOTE — Telephone Encounter (Signed)
Spoke with patient, she states she is not able to make it to may 8 appointment as she will be travelling and not returning until June. Offered next available appointment in June. Assisted to schedule. She will bring by her medical records and imaging.

## 2022-01-16 ENCOUNTER — Other Ambulatory Visit: Payer: Self-pay | Admitting: Orthopaedic Surgery

## 2022-02-15 NOTE — Progress Notes (Signed)
Shelly Neal, M.D.  Assistant Clinical Professor of Orthopaedic Surgery  Orthopaedic Trauma and Hip Preservation  [place] [place]  [address]  [address]  [place], [place] [address]    [phone]   [email or web address]      Shelly Neal  12/10/1945  [id number]      CC: Osteomyelitis of the pelvis    HPI: 76 year old female with a history of osteomyelitis of the pelvis. Patient previously underwent osteotomy by Dr. Allison in June 2019 at [facility], which she reports greatly reduced her pain. She complains of a dull, achy right posterior pelvis pain, 3/10 in clinic today. Notes mild improvement with Prednisone 20 mg (per Dr. Karamlou) and 2 tablets of Tylenol. Minor improvement with rocking in place and a cold patch. No improvement with fentanyl patch. Improved with CBD oil, but has nausea side effect. States the pain becomes sharp if she forgets to take medication, at which point she is unable to walk. Patient notes she would otherwise be unable to communicate without Prednisone + Tylenol. Denies history of UTI. She also complains of a burning sensation in her abdomen.     Currently on IV Actemra q/4 weeks for rheumatoid arthritis, managed by Dr. Karamlou in [place].    History of right femur benign tumor, excised at [facility]. H/o 2 c-sections. FHx of cancer, unspecified. No personal history of cancer. Reports her Hematologist states her labs within the last 4 months are increasingly indicative of multiple myeloma.    Past Medical History:   Diagnosis Date   . Cataract    . Dry eye    . Hypothyroidism    . RA (rheumatoid arthritis) (CMS-HCC)        Past Surgical History:   Procedure Laterality Date   . CATARACT EXTRACTION Right 09/02/2020       No family history on file.    Social History     Socioeconomic History   . Marital status: Married     Spouse name: Not on file   . Number of children: Not on file   . Years of education: Not on file   . Highest education  level: Not on file   Occupational History   . Not on file   Tobacco Use   . Smoking status: Never   . Smokeless tobacco: Never   Substance and Sexual Activity   . Alcohol use: Never   . Drug use: Never   . Sexual activity: Not on file   Other Topics Concern   . Not on file   Social History Narrative   . Not on file     Social Determinants of Health     Financial Resource Strain: Not on file   Food Insecurity: Not on file   Transportation Needs: Not on file   Physical Activity: Not on file   Stress: Not on file   Social Connections: Not on file   Intimate Partner Violence: Not on file   Housing Stability: Not on file         Current Outpatient Medications:   .  acetaminophen (TYLENOL) 500 MG tablet, Take 1 tablet (500 mg) by mouth., Disp: , Rfl:   .  albuterol 108 (90 Base) MCG/ACT inhaler, Inhale by mouth., Disp: , Rfl:   .  anterior blue eye kit, USED AS INSTRUCTED BY YOUR PHYSICIAN (KIT FOR RIGHT EYE), Disp: 1 kit, Rfl: 0  .  Azelastine HCl 0.15 % SOLN, 1 drop in   each nostril, Disp: , Rfl:   .  Azelastine HCl 137 MCG/SPRAY SOLN, SPRAY 1 SPRAY INTO EACH NOSTRIL TWICE A DAY, Disp: , Rfl:   .  budesonide-formoterol (SYMBICORT) 160-4.5 MCG/ACT inhaler, Inhale 2 puffs by mouth., Disp: , Rfl:   .  budesonide-formoterol (SYMBICORT) 160-4.5 MCG/ACT inhaler, Inhale 2 puffs by mouth., Disp: , Rfl:   .  Carboxymethylcellulose Sodium (THERATEARS) 0.25 % SOLN, Place 1 drop into both eyes daily., Disp: , Rfl:   .  cetirizine (ZYRTEC) 10 MG tablet, Take 1 tablet (10 mg) by mouth daily., Disp: , Rfl:   .  cetirizine (ZYRTEC) 5 MG tablet, Take 2 tablets (10 mg) by mouth., Disp: , Rfl:   .  cycloSPORINE (RESTASIS) 0.05 % ophthalmic emulsion, 1 drop by Ophthalmic route., Disp: , Rfl:   .  cycloSPORINE (RESTASIS) 0.05 % ophthalmic emulsion, Place 1 drop into both eyes 2 times daily., Disp: 2 each, Rfl: 0  .  ergocalciferol (VITAMIN D) 50000 UNIT capsule, TAKE 1 CAPSULE BY MOUTH WEEKLY FOR 8 WEEKS, Disp: , Rfl:   .  fentaNYL  (DURAGESIC) 25 MCG/HR patch, PLACE 1 PATCH ONTO THE SKIN EVERY 72 HOURS., Disp: , Rfl:   .  fluorometholone (FML) 0.1 % ophthalmic suspension, Place 1 drop into both eyes 4 times daily., Disp: 1 bottle, Rfl: 0  .  fluticasone propionate (FLONASE) 50 MCG/ACT nasal spray, 1 spray by Nasal route., Disp: , Rfl:   .  fluticasone-salmeterol (WIXELA INHUB) 250-50 MCG/DOSE inhaler, , Disp: , Rfl:   .  folic acid (FOLVITE) 1 MG tablet, 1 tablet (1 mg) by Oral route., Disp: , Rfl:   .  HYDROcodone-acetaminophen (NORCO) 10-325 MG tablet, , Disp: , Rfl:   .  levothyroxine (SYNTHROID) 88 MCG tablet, Take 1 tablet (88 mcg) by mouth., Disp: , Rfl:   .  lidocaine-prilocaine (EMLA) 2.5-2.5 % cream, APPLY 1 APPLICATION ON THE SKIN AS DIRECTED APPLY 10 GRAMS TO SKIN 30 MINUTES PRIOR TO PROCEDURE., Disp: , Rfl:   .  methotrexate (RHEUMATREX) 250 MG/10ML SOLN injection, INJECT 0.8 ML EVERY 7 DAYS--MULTIDOSE VIALS, Disp: , Rfl:   .  methotrexate (TREXALL) 2.5 MG tablet, As Directed, Disp: , Rfl:   .  Methotrexate, PF, (RASUVO) 7.5 MG/0.15ML SOAJ, Inject 0.15 mL under the skin every 7 days., Disp: , Rfl:   .  naloxegol oxalate (MOVANTIK) 12.5 MG TABS tablet, Take 12.5-25 mg by mouth., Disp: , Rfl:   .  naloxone (NARCAN) 4 mg/0.1 mL nasal spray, 1 spray., Disp: , Rfl:   .  naproxen (ALEVE) 220 MG tablet, Take by mouth., Disp: , Rfl:   .  nitrofurantoin monohydrate (MACROBID) 100 MG capsule, TAKE 1 CAPSULE BY MOUTH EVERY 12 HOURS WITH FOOD, Disp: , Rfl:   .  Olopatadine HCl 0.7 % SOLN, Place 1 drop into both eyes daily., Disp: , Rfl:   .  oxyCODONE (ROXICODONE) 5 MG capsule, TAKE 1 CAPSULE BY MOUTH EVERY 6 HOURS IF NEEDED (PAIN)., Disp: , Rfl:   .  Oyster Shell Calcium 500 MG TABS, 1 tablet, Disp: , Rfl:   .  phenazopyridine (PYRIDIUM) 100 MG tablet, TAKE 1 TABLET BY MOUTH THREE TIMES A DAY AFTER MEALS AS NEEDED, Disp: , Rfl:   .  prednisoLONE acetate (PRED FORTE) 1 % ophthalmic suspension, PLACE 1 DROP INTO RIGHT EYE 4 TIMES DAILY. START 3  DAYS PRIOR TO SURGERY, Disp: , Rfl:   .  predniSONE (DELTASONE) 5 MG tablet, Take 2 tablets (10 mg) by mouth   daily., Disp: , Rfl:   .  PREDNISONE PO, , Disp: , Rfl:   .  sertraline (ZOLOFT) 100 MG tablet, Take 1 tablet (100 mg) by mouth daily., Disp: , Rfl:   .  sertraline (ZOLOFT) 100 MG tablet, Take 1.5 tablets (150 mg) by mouth daily., Disp: , Rfl:   .  SYNTHROID 88 MCG tablet, Take 1 tablet (88 mcg) by mouth daily., Disp: , Rfl:   .  tiotropium (SPIRIVA HANDIHALER) 18 MCG inhalation capsule, Inhale by mouth., Disp: , Rfl:   .  tobramycin (TOBREX) 0.3 % ophthalmic solution, Place 1 drop into right eye every 4 hours. Start 3 days prior to surgery, Disp: 1 each, Rfl: 0  .  tobramycin (TOBREX) 0.3 % ophthalmic solution, Place 1 drop into right eye 4 times daily., Disp: 5 mL, Rfl: 0  .  tobramycin (TOBREX) 0.3 % ophthalmic solution, Place 1 drop into right eye 4 times daily. Start 3 days prior to surgery, Disp: 1 bottle, Rfl: 0  .  tocilizumab (ACTEMRA) 162 MG/0.9ML SOSY injection, Inject 0.9 mL (162 mg) under the skin every 7 days., Disp: , Rfl:   .  XIIDRA 5 % SOLN, , Disp: , Rfl:     Allergies  Allergies   Allergen Reactions   . Cefaclor Hives     Other reaction(s): Pruritic rash (disorder)  Other reaction(s): Pruritic rash (disorder)  No problems with amoxicillin/clavulanate - last taken October 2019  Other reaction(s): Pruritic rash (disorder)  Other reaction(s): Pruritic rash (disorder)  Other reaction(s): Pruritic rash (disorder)    Other reaction(s): Unknown  Other reaction(s): Pruritic rash (disorder)  Other reaction(s): Pruritic rash (disorder)  Other reaction(s): Pruritic rash (disorder)  Other reaction(s): Pruritic rash (disorder)  No problems with amoxicillin/clavulanate - last taken October 2019  Other reaction(s): Pruritic rash (disorder)  Other reaction(s): Pruritic rash (disorder)  No problems with amoxicillin/clavulanate - last taken October 2019  Other reaction(s): Pruritic rash (disorder)  Other  reaction(s): Pruritic rash (disorder)  Other reaction(s): Pruritic rash (disorder)    No problems with amoxicillin/clavulanate - last taken October 2019  Other reaction(s): Pruritic rash (disorder)  No problems with amoxicillin/clavulanate - last taken October 2019  No problems with amoxicillin/clavulanate - last taken October 2019  Other reaction(s): Pruritic rash (disorder)  No problems with amoxicillin/clavulanate - last taken October 2019  No problems with amoxicillin/clavulanate - last taken October 2019  Other reaction(s): Pruritic rash (disorder)  No problems with amoxicillin/clavulanate - last taken October 2019     . Ciprofloxacin Rash   . Diagnostic X-Ray Materials Rash   . Fd&C Blue #2 [place] Rash   . Gabapentin Nausea and Vomiting   . Hydromorphone Rash     pruritic rash to left arm - no shortness of breath  pruritic rash to left arm - no shortness of breath  pruritic rash to left arm - no shortness of breath  pruritic rash to left arm - no shortness of breath  pruritic rash to left arm - no shortness of breath    pruritic rash to left arm - no shortness of breath  pruritic rash to left arm - no shortness of breath  pruritic rash to left arm - no shortness of breath  pruritic rash to left arm - no shortness of breath  pruritic rash to left arm - no shortness of breath  pruritic rash to left arm - no shortness of breath  pruritic rash to left arm - no shortness of breath    pruritic rash to left arm - no shortness of breath  pruritic rash to left arm - no shortness of breath  pruritic rash to left arm - no shortness of breath  pruritic rash to left arm - no shortness of breath  pruritic rash to left arm - no shortness of breath  pruritic rash to left arm - no shortness of breath     . Ibuprofen Hives and Rash     Aleve    Aleve  Aleve  Aleve  Aleve  Aleve     . Meloxicam Rash   . Naproxen Rash   . Neomycin-Polymyxin B Gu Rash   . Polyethylene Glycol Hives     Other reaction(s): Pruritic rash  (disorder)  Other reaction(s): Pruritic rash (disorder)  Other reaction(s): Pruritic rash (disorder)  Other reaction(s): Pruritic rash (disorder)    Other reaction(s): Pruritic rash (disorder)  Other reaction(s): Pruritic rash (disorder)  Other reaction(s): Pruritic rash (disorder)  Other reaction(s): Pruritic rash (disorder)  Other reaction(s): Pruritic rash (disorder)  Other reaction(s): Pruritic rash (disorder)  Other reaction(s): Pruritic rash (disorder)  Other reaction(s): Pruritic rash (disorder)  Other reaction(s): Pruritic rash (disorder)  Other reaction(s): Pruritic rash (disorder)  Other reaction(s): Pruritic rash (disorder)     . Pseudoephedrine Rash   . Sulfamethoxazole W-Trimethoprim Hives and Rash     Other reaction(s): Pruritic rash (disorder)  Other reaction(s): Pruritic rash (disorder)    Other reaction(s): Unknown  Other reaction(s): Pruritic rash (disorder)  Other reaction(s): Pruritic rash (disorder)  Other reaction(s): Pruritic rash (disorder)  Other reaction(s): Pruritic rash (disorder)  Other reaction(s): Pruritic rash (disorder)  Other reaction(s): Pruritic rash (disorder)  Other reaction(s): Pruritic rash (disorder)    Other reaction(s): Pruritic rash (disorder)  Other reaction(s): Pruritic rash (disorder)    Other reaction(s): Pruritic rash (disorder)  Other reaction(s): Pruritic rash (disorder)  Other reaction(s): Pruritic rash (disorder)     . Guaifenesin Unspecified   . Homeopathic Products Other   . Sulfamethoxazole Other   . Trimethoprim Other   . Iodine Other       Review of Systems  13 point review of systems negative unless otherwise noted in the HPI.    PE  Vitals:  BP 119/72   Pulse 68   Temp 96.7 F (35.9 C)   Resp 20   Ht 5' 9" (1.753 m)   Wt 73.6 kg (162 lb 4.1 oz)   BMI 23.96 kg/m   General  - Normocephalic, atraumatic. No acute distress.   Psych  - A&O x3, normal questions/judgement  Pulmonary  - Speaks in full sentences, no retractions  Bilateral Lower  Extremity:  Skin: Intact, ***  Vascular: DP Pulse ***, PT Pulse ***  Neuro:   - Sensation      T/DP/SP/S/S intact   - Motor exam:  hip flexion     ***/5  hip abduction     ***/5  knee flexion     ***/5  knee extension    ***/5  Dorsiflexion     ***/5  Extensor Hallucis Longus   ***/5  Plantarflexion     ***/5    - ROM:   Hip   ***  Knee   ***  Ankle   ***      Imaging  X-rays of the pelvis obtained today and interpreted by me show ***    A/P  76 year old female with osteomyelitis of the pelvis. Imaging shows The patient expresses a desire for surgery to improve   her pain. Will order CT Chest/Abdomen/Pelvis and MRI Pelvis to further assess. Skeletal survey for myeloma done in clinic today. Will discuss with Dr. Stitzlein and Dr. Goldin. ***    - WBAT RLE  - CT Chest/Abdomen/Pelvis ordered  - MRI Pelvis ordered  - Return to clinic 3 weeks.    I have personally reviewed and interpreted the patient's prior diagnostic testing, relevant imaging, and external notes from their other treating physicians in the process of formulating my assessment and plan of care for their current symptoms and pathology.    I have also personally discussed the patient's diagnostic testing, relevant imaging, and my assessment and plan with the patient's relevant external physicians and healthcare providers as needed    Scribe Attestation  The notes I am recording reflect only actions made by and judgments taken by this provider, Dr. Tasheika Kitzmiller, for whom I am scribing today.  I have performed no independent clinical work.    Alan Nguyen    ____________________________________________________________________    {Double click to enter provider attestation for scribed note:[id number]}

## 2022-02-15 NOTE — Progress Notes (Signed)
R. , M.D.  Assistant Clinical Professor of Orthopaedic Surgery  Orthopaedic Trauma and Hip Preservation  [place], [place]  [address]  [address]  [place], [place] [address]    [phone]   [email or web address]      Allison David  02/02/1946  [id number]      CC: Osteomyelitis of the pelvis    HPI: 76 year old female with a history of osteomyelitis of the pelvis. Patient previously underwent osteotomy by Dr. Allison in June 2019 at [facility], which she reports greatly reduced her pain. She complains of a dull, achy right posterior pelvis pain, 3/10 in clinic today. Notes mild improvement with Prednisone 20 mg (per Dr. Karamlou) and 2 tablets of Tylenol. Minor improvement with rocking in place and a cold patch. No improvement with fentanyl patch. Improved with CBD oil, but has nausea side effect. States the pain becomes sharp if she forgets to take medication, at which point she is unable to walk. Patient notes she would otherwise be unable to communicate without Prednisone + Tylenol. Denies history of UTI. She also complains of a burning sensation in her abdomen.     Currently on IV Actemra q/4 weeks for rheumatoid arthritis, managed by Dr. Karamlou in [place].    History of right femur benign tumor, excised at [facility]. H/o 2 c-sections. FHx of cancer, unspecified. No personal history of cancer. Reports her Hematologist states her labs within the last 4 months are increasingly indicative of multiple myeloma.    Past Medical History:   Diagnosis Date   . Cataract    . Dry eye    . Hypothyroidism    . RA (rheumatoid arthritis) (CMS-HCC)        Past Surgical History:   Procedure Laterality Date   . CATARACT EXTRACTION Right 09/02/2020       No family history on file.    Social History     Socioeconomic History   . Marital status: Married     Spouse name: Not on file   . Number of children: Not on file   . Years of education: Not on file   . Highest education  level: Not on file   Occupational History   . Not on file   Tobacco Use   . Smoking status: Never   . Smokeless tobacco: Never   Substance and Sexual Activity   . Alcohol use: Never   . Drug use: Never   . Sexual activity: Not on file   Other Topics Concern   . Not on file   Social History Narrative   . Not on file     Social Determinants of Health     Financial Resource Strain: Not on file   Food Insecurity: Not on file   Transportation Needs: Not on file   Physical Activity: Not on file   Stress: Not on file   Social Connections: Not on file   Intimate Partner Violence: Not on file   Housing Stability: Not on file         Current Outpatient Medications:   .  acetaminophen (TYLENOL) 500 MG tablet, Take 1 tablet (500 mg) by mouth., Disp: , Rfl:   .  albuterol 108 (90 Base) MCG/ACT inhaler, Inhale by mouth., Disp: , Rfl:   .  anterior blue eye kit, USED AS INSTRUCTED BY YOUR PHYSICIAN (KIT FOR RIGHT EYE), Disp: 1 kit, Rfl: 0  .  Azelastine HCl 0.15 % SOLN, 1 drop in   each nostril, Disp: , Rfl:   .  Azelastine HCl 137 MCG/SPRAY SOLN, SPRAY 1 SPRAY INTO EACH NOSTRIL TWICE A DAY, Disp: , Rfl:   .  budesonide-formoterol (SYMBICORT) 160-4.5 MCG/ACT inhaler, Inhale 2 puffs by mouth., Disp: , Rfl:   .  budesonide-formoterol (SYMBICORT) 160-4.5 MCG/ACT inhaler, Inhale 2 puffs by mouth., Disp: , Rfl:   .  Carboxymethylcellulose Sodium (THERATEARS) 0.25 % SOLN, Place 1 drop into both eyes daily., Disp: , Rfl:   .  cetirizine (ZYRTEC) 10 MG tablet, Take 1 tablet (10 mg) by mouth daily., Disp: , Rfl:   .  cetirizine (ZYRTEC) 5 MG tablet, Take 2 tablets (10 mg) by mouth., Disp: , Rfl:   .  cycloSPORINE (RESTASIS) 0.05 % ophthalmic emulsion, 1 drop by Ophthalmic route., Disp: , Rfl:   .  cycloSPORINE (RESTASIS) 0.05 % ophthalmic emulsion, Place 1 drop into both eyes 2 times daily., Disp: 2 each, Rfl: 0  .  ergocalciferol (VITAMIN D) 50000 UNIT capsule, TAKE 1 CAPSULE BY MOUTH WEEKLY FOR 8 WEEKS, Disp: , Rfl:   .  fentaNYL  (DURAGESIC) 25 MCG/HR patch, PLACE 1 PATCH ONTO THE SKIN EVERY 72 HOURS., Disp: , Rfl:   .  fluorometholone (FML) 0.1 % ophthalmic suspension, Place 1 drop into both eyes 4 times daily., Disp: 1 bottle, Rfl: 0  .  fluticasone propionate (FLONASE) 50 MCG/ACT nasal spray, 1 spray by Nasal route., Disp: , Rfl:   .  fluticasone-salmeterol (WIXELA INHUB) 250-50 MCG/DOSE inhaler, , Disp: , Rfl:   .  folic acid (FOLVITE) 1 MG tablet, 1 tablet (1 mg) by Oral route., Disp: , Rfl:   .  HYDROcodone-acetaminophen (NORCO) 10-325 MG tablet, , Disp: , Rfl:   .  levothyroxine (SYNTHROID) 88 MCG tablet, Take 1 tablet (88 mcg) by mouth., Disp: , Rfl:   .  lidocaine-prilocaine (EMLA) 2.5-2.5 % cream, APPLY 1 APPLICATION ON THE SKIN AS DIRECTED APPLY 10 GRAMS TO SKIN 30 MINUTES PRIOR TO PROCEDURE., Disp: , Rfl:   .  methotrexate (RHEUMATREX) 250 MG/10ML SOLN injection, INJECT 0.8 ML EVERY 7 DAYS--MULTIDOSE VIALS, Disp: , Rfl:   .  methotrexate (TREXALL) 2.5 MG tablet, As Directed, Disp: , Rfl:   .  Methotrexate, PF, (RASUVO) 7.5 MG/0.15ML SOAJ, Inject 0.15 mL under the skin every 7 days., Disp: , Rfl:   .  naloxegol oxalate (MOVANTIK) 12.5 MG TABS tablet, Take 12.5-25 mg by mouth., Disp: , Rfl:   .  naloxone (NARCAN) 4 mg/0.1 mL nasal spray, 1 spray., Disp: , Rfl:   .  naproxen (ALEVE) 220 MG tablet, Take by mouth., Disp: , Rfl:   .  nitrofurantoin monohydrate (MACROBID) 100 MG capsule, TAKE 1 CAPSULE BY MOUTH EVERY 12 HOURS WITH FOOD, Disp: , Rfl:   .  Olopatadine HCl 0.7 % SOLN, Place 1 drop into both eyes daily., Disp: , Rfl:   .  oxyCODONE (ROXICODONE) 5 MG capsule, TAKE 1 CAPSULE BY MOUTH EVERY 6 HOURS IF NEEDED (PAIN)., Disp: , Rfl:   .  Oyster Shell Calcium 500 MG TABS, 1 tablet, Disp: , Rfl:   .  phenazopyridine (PYRIDIUM) 100 MG tablet, TAKE 1 TABLET BY MOUTH THREE TIMES A DAY AFTER MEALS AS NEEDED, Disp: , Rfl:   .  prednisoLONE acetate (PRED FORTE) 1 % ophthalmic suspension, PLACE 1 DROP INTO RIGHT EYE 4 TIMES DAILY. START 3  DAYS PRIOR TO SURGERY, Disp: , Rfl:   .  predniSONE (DELTASONE) 5 MG tablet, Take 2 tablets (10 mg) by mouth   daily., Disp: , Rfl:   .  PREDNISONE PO, , Disp: , Rfl:   .  sertraline (ZOLOFT) 100 MG tablet, Take 1 tablet (100 mg) by mouth daily., Disp: , Rfl:   .  sertraline (ZOLOFT) 100 MG tablet, Take 1.5 tablets (150 mg) by mouth daily., Disp: , Rfl:   .  SYNTHROID 88 MCG tablet, Take 1 tablet (88 mcg) by mouth daily., Disp: , Rfl:   .  tiotropium (SPIRIVA HANDIHALER) 18 MCG inhalation capsule, Inhale by mouth., Disp: , Rfl:   .  tobramycin (TOBREX) 0.3 % ophthalmic solution, Place 1 drop into right eye every 4 hours. Start 3 days prior to surgery, Disp: 1 each, Rfl: 0  .  tobramycin (TOBREX) 0.3 % ophthalmic solution, Place 1 drop into right eye 4 times daily., Disp: 5 mL, Rfl: 0  .  tobramycin (TOBREX) 0.3 % ophthalmic solution, Place 1 drop into right eye 4 times daily. Start 3 days prior to surgery, Disp: 1 bottle, Rfl: 0  .  tocilizumab (ACTEMRA) 162 MG/0.9ML SOSY injection, Inject 0.9 mL (162 mg) under the skin every 7 days., Disp: , Rfl:   .  XIIDRA 5 % SOLN, , Disp: , Rfl:     Allergies  Allergies   Allergen Reactions   . Cefaclor Hives     Other reaction(s): Pruritic rash (disorder)  Other reaction(s): Pruritic rash (disorder)  No problems with amoxicillin/clavulanate - last taken October 2019  Other reaction(s): Pruritic rash (disorder)  Other reaction(s): Pruritic rash (disorder)  Other reaction(s): Pruritic rash (disorder)    Other reaction(s): Unknown  Other reaction(s): Pruritic rash (disorder)  Other reaction(s): Pruritic rash (disorder)  Other reaction(s): Pruritic rash (disorder)  Other reaction(s): Pruritic rash (disorder)  No problems with amoxicillin/clavulanate - last taken October 2019  Other reaction(s): Pruritic rash (disorder)  Other reaction(s): Pruritic rash (disorder)  No problems with amoxicillin/clavulanate - last taken October 2019  Other reaction(s): Pruritic rash (disorder)  Other  reaction(s): Pruritic rash (disorder)  Other reaction(s): Pruritic rash (disorder)    No problems with amoxicillin/clavulanate - last taken October 2019  Other reaction(s): Pruritic rash (disorder)  No problems with amoxicillin/clavulanate - last taken October 2019  No problems with amoxicillin/clavulanate - last taken October 2019  Other reaction(s): Pruritic rash (disorder)  No problems with amoxicillin/clavulanate - last taken October 2019  No problems with amoxicillin/clavulanate - last taken October 2019  Other reaction(s): Pruritic rash (disorder)  No problems with amoxicillin/clavulanate - last taken October 2019     . Ciprofloxacin Rash   . Diagnostic X-Ray Materials Rash   . Fd&C Blue #2 [place] Rash   . Gabapentin Nausea and Vomiting   . Hydromorphone Rash     pruritic rash to left arm - no shortness of breath  pruritic rash to left arm - no shortness of breath  pruritic rash to left arm - no shortness of breath  pruritic rash to left arm - no shortness of breath  pruritic rash to left arm - no shortness of breath    pruritic rash to left arm - no shortness of breath  pruritic rash to left arm - no shortness of breath  pruritic rash to left arm - no shortness of breath  pruritic rash to left arm - no shortness of breath  pruritic rash to left arm - no shortness of breath  pruritic rash to left arm - no shortness of breath  pruritic rash to left arm - no shortness of breath  pruritic rash to left arm - no shortness of breath  pruritic rash to left arm - no shortness of breath  pruritic rash to left arm - no shortness of breath  pruritic rash to left arm - no shortness of breath  pruritic rash to left arm - no shortness of breath  pruritic rash to left arm - no shortness of breath     . Ibuprofen Hives and Rash     Aleve    Aleve  Aleve  Aleve  Aleve  Aleve     . Meloxicam Rash   . Naproxen Rash   . Neomycin-Polymyxin B Gu Rash   . Polyethylene Glycol Hives     Other reaction(s): Pruritic rash  (disorder)  Other reaction(s): Pruritic rash (disorder)  Other reaction(s): Pruritic rash (disorder)  Other reaction(s): Pruritic rash (disorder)    Other reaction(s): Pruritic rash (disorder)  Other reaction(s): Pruritic rash (disorder)  Other reaction(s): Pruritic rash (disorder)  Other reaction(s): Pruritic rash (disorder)  Other reaction(s): Pruritic rash (disorder)  Other reaction(s): Pruritic rash (disorder)  Other reaction(s): Pruritic rash (disorder)  Other reaction(s): Pruritic rash (disorder)  Other reaction(s): Pruritic rash (disorder)  Other reaction(s): Pruritic rash (disorder)  Other reaction(s): Pruritic rash (disorder)     . Pseudoephedrine Rash   . Sulfamethoxazole W-Trimethoprim Hives and Rash     Other reaction(s): Pruritic rash (disorder)  Other reaction(s): Pruritic rash (disorder)    Other reaction(s): Unknown  Other reaction(s): Pruritic rash (disorder)  Other reaction(s): Pruritic rash (disorder)  Other reaction(s): Pruritic rash (disorder)  Other reaction(s): Pruritic rash (disorder)  Other reaction(s): Pruritic rash (disorder)  Other reaction(s): Pruritic rash (disorder)  Other reaction(s): Pruritic rash (disorder)    Other reaction(s): Pruritic rash (disorder)  Other reaction(s): Pruritic rash (disorder)    Other reaction(s): Pruritic rash (disorder)  Other reaction(s): Pruritic rash (disorder)  Other reaction(s): Pruritic rash (disorder)     . Guaifenesin Unspecified   . Homeopathic Products Other   . Sulfamethoxazole Other   . Trimethoprim Other   . Iodine Other       Review of Systems  13 point review of systems negative unless otherwise noted in the HPI.    PE  Vitals:  BP 119/72   Pulse 68   Temp 96.7 F (35.9 C)   Resp 20   Ht 5' 9" (1.753 m)   Wt 73.6 kg (162 lb 4.1 oz)   BMI 23.96 kg/m   General  - Normocephalic, atraumatic. No acute distress.   Psych  - A&O x3, normal questions/judgement  Pulmonary  - Speaks in full sentences, no retractions  Bilateral Lower  Extremity:  Skin: Intact, ***  Vascular: DP Pulse ***, PT Pulse ***  Neuro:   - Sensation      T/DP/SP/S/S intact   - Motor exam:  hip flexion     ***/5  hip abduction     ***/5  knee flexion     ***/5  knee extension    ***/5  Dorsiflexion     ***/5  Extensor Hallucis Longus   ***/5  Plantarflexion     ***/5    - ROM:   Hip   ***  Knee   ***  Ankle   ***      Imaging  X-rays of the pelvis obtained today and interpreted by me show ***    A/P  76 year old female with osteomyelitis of the pelvis. Imaging shows The patient expresses a desire for surgery to improve   her pain. Will order CT Chest/Abdomen/Pelvis and MRI Pelvis to further assess. Skeletal survey for myeloma done in clinic today. Will discuss with Dr. Stitzlein and Dr. Goldin. ***    - WBAT RLE  - CT Chest/Abdomen/Pelvis ordered  - MRI Pelvis ordered  - Return to clinic 3 weeks.    I have personally reviewed and interpreted the patient's prior diagnostic testing, relevant imaging, and external notes from their other treating physicians in the process of formulating my assessment and plan of care for their current symptoms and pathology.    I have also personally discussed the patient's diagnostic testing, relevant imaging, and my assessment and plan with the patient's relevant external physicians and healthcare providers as needed    Scribe Attestation  The notes I am recording reflect only actions made by and judgments taken by this provider, Dr.  , for whom I am scribing today.  I have performed no independent clinical work.    Alan Nguyen    ____________________________________________________________________    {Double click to enter provider attestation for scribed note:[id number]}

## 2022-02-16 ENCOUNTER — Ambulatory Visit (HOSPITAL_BASED_OUTPATIENT_CLINIC_OR_DEPARTMENT_OTHER): Admitting: Orthopaedic Surgery

## 2022-02-16 ENCOUNTER — Ambulatory Visit: Admit: 2022-02-16 | Discharge: 2022-02-16 | Disposition: A | Discharge status: Home Health

## 2022-02-16 ENCOUNTER — Ambulatory Visit
Admission: RE | Admit: 2022-02-16 | Discharge: 2022-02-16 | Disposition: A | Discharge status: Home / Self Care | Attending: Orthopaedic Surgery | Admitting: Orthopaedic Surgery

## 2022-02-16 ENCOUNTER — Ambulatory Visit (HOSPITAL_BASED_OUTPATIENT_CLINIC_OR_DEPARTMENT_OTHER): Admit: 2022-02-16 | Discharge: 2022-02-16 | Disposition: A | Discharge status: Home Health | Payer: Medicare Other

## 2022-02-16 ENCOUNTER — Ambulatory Visit
Admission: RE | Admit: 2022-02-16 | Discharge: 2022-02-16 | Disposition: A | Discharge status: Home / Self Care | Payer: Medicare Other | Attending: Orthopaedic Surgery | Admitting: Orthopaedic Surgery

## 2022-02-16 ENCOUNTER — Ambulatory Visit (HOSPITAL_BASED_OUTPATIENT_CLINIC_OR_DEPARTMENT_OTHER): Payer: Medicare Other | Admitting: Orthopaedic Surgery

## 2022-02-16 VITALS — BP 119/72 | HR 68 | Temp 96.7°F | Resp 20 | Ht 69.0 in | Wt 162.3 lb

## 2022-02-16 DIAGNOSIS — M160 Bilateral primary osteoarthritis of hip: Secondary | ICD-10-CM

## 2022-02-16 DIAGNOSIS — Z6823 Body mass index (BMI) 23.0-23.9, adult: Secondary | ICD-10-CM

## 2022-02-16 DIAGNOSIS — M86659 Other chronic osteomyelitis, unspecified thigh: Secondary | ICD-10-CM

## 2022-02-16 DIAGNOSIS — M47816 Spondylosis without myelopathy or radiculopathy, lumbar region: Secondary | ICD-10-CM

## 2022-02-16 DIAGNOSIS — C9000 Multiple myeloma not having achieved remission: Secondary | ICD-10-CM

## 2022-02-16 DIAGNOSIS — M170 Bilateral primary osteoarthritis of knee: Secondary | ICD-10-CM

## 2022-02-16 MED ORDER — RASUVO 7.5 MG/0.15ML SC SOAJ: 0.1500 mL | SUBCUTANEOUS | Status: AC

## 2022-02-16 MED ORDER — CYCLOSPORINE 0.05 % OP EMUL
1.0000 [drp] | OPHTHALMIC | Status: DC
Start: 2020-08-03 — End: 2022-03-19

## 2022-02-16 MED ORDER — BUDESONIDE-FORMOTEROL FUMARATE 160-4.5 MCG/ACT IN AERO
2.0000 | INHALATION_SPRAY | RESPIRATORY_TRACT | Status: DC
Start: ? — End: 2022-03-19

## 2022-02-16 MED ORDER — METHOTREXATE SODIUM 250 MG/10ML IJ SOLN
INTRAMUSCULAR | Status: AC
Start: 2021-12-23 — End: ?

## 2022-02-16 MED ORDER — SERTRALINE HCL 100 MG OR TABS
1.0000 | ORAL_TABLET | Freq: Every day | ORAL | Status: AC
Start: 2022-01-26 — End: ?

## 2022-02-16 MED ORDER — HYDROCODONE-ACETAMINOPHEN 10-325 MG OR TABS
ORAL_TABLET | ORAL | Status: DC
Start: 2022-02-14 — End: 2022-03-19

## 2022-02-16 MED ORDER — LEVOTHYROXINE SODIUM 88 MCG OR TABS
88.0000 ug | ORAL_TABLET | ORAL | Status: AC
Start: 2020-11-12 — End: ?

## 2022-02-16 MED ORDER — FENTANYL 25 MCG/HR TD PT72
MEDICATED_PATCH | TRANSDERMAL | Status: DC
Start: 2022-01-03 — End: 2022-03-19

## 2022-02-16 MED ORDER — PREDNISOLONE ACETATE 1 % OP SUSP
OPHTHALMIC | Status: DC
Start: 2020-08-03 — End: 2022-03-21

## 2022-02-16 MED ORDER — NALOXEGOL OXALATE 12.5 MG PO TABS: 12.5000 mg | ORAL_TABLET | ORAL | Status: AC

## 2022-02-16 MED ORDER — ACTEMRA 162 MG/0.9ML SC SOSY: 0.9000 mL | PREFILLED_SYRINGE | SUBCUTANEOUS | Status: AC

## 2022-02-16 MED ORDER — PREDNISONE 5 MG OR TABS
10.0000 mg | ORAL_TABLET | Freq: Every day | ORAL | Status: DC
Start: 2022-02-13 — End: 2022-03-21

## 2022-02-16 MED ORDER — ALBUTEROL SULFATE 108 (90 BASE) MCG/ACT IN AERS: 1.00 | INHALATION_SPRAY | Freq: Four times a day (QID) | RESPIRATORY_TRACT | Status: AC | PRN

## 2022-02-16 MED ORDER — LIDOCAINE-PRILOCAINE 2.5-2.5 % EX CREA
TOPICAL_CREAM | CUTANEOUS | Status: AC
Start: 2021-12-03 — End: ?

## 2022-02-16 MED ORDER — TIOTROPIUM BROMIDE MONOHYDRATE 18 MCG IN CAPS
ORAL_CAPSULE | RESPIRATORY_TRACT | Status: DC | PRN
Start: ? — End: 2022-03-21

## 2022-02-16 MED ORDER — NAPROXEN SODIUM 220 MG OR TABS
ORAL_TABLET | ORAL | Status: DC
Start: ? — End: 2022-03-19

## 2022-02-16 MED ORDER — NITROFURANTOIN MONOHYD MACRO 100 MG OR CAPS
ORAL_CAPSULE | ORAL | Status: DC
Start: 2022-01-07 — End: 2022-03-19

## 2022-02-16 MED ORDER — VITAMIN D (ERGOCALCIFEROL) 1.25 MG (50000 UT) PO CAPS
ORAL_CAPSULE | ORAL | Status: AC
Start: 2022-01-14 — End: ?

## 2022-02-16 MED ORDER — AZELASTINE HCL 137 MCG/SPRAY NA SOLN
NASAL | Status: AC
Start: 2022-01-16 — End: ?

## 2022-02-16 MED ORDER — ACETAMINOPHEN 500 MG OR TABS
500.00 mg | ORAL_TABLET | Freq: Four times a day (QID) | ORAL | Status: DC | PRN
Start: ? — End: 2022-03-19

## 2022-02-16 MED ORDER — CETIRIZINE HCL 5 MG OR TABS: 10.0000 mg | ORAL_TABLET | ORAL | Status: AC

## 2022-02-16 MED ORDER — PHENAZOPYRIDINE HCL 100 MG OR TABS
ORAL_TABLET | ORAL | Status: DC
Start: 2022-01-07 — End: 2022-03-19

## 2022-02-16 MED ORDER — NALOXONE HCL 4 MG/0.1ML NA LIQD
1.0000 | NASAL | Status: AC
Start: 2022-01-01 — End: 2023-01-01

## 2022-02-16 MED ORDER — OXYCODONE HCL 5 MG OR CAPS
ORAL_CAPSULE | ORAL | Status: DC
Start: 2021-10-24 — End: 2022-03-13

## 2022-02-16 MED ORDER — PREDNISONE 5 MG OR TABS
10.00 mg | ORAL_TABLET | Freq: Every day | ORAL | Status: AC
Start: 2022-02-13 — End: ?

## 2022-02-16 MED ORDER — PHENAZOPYRIDINE HCL 100 MG OR TABS
ORAL_TABLET | ORAL | Status: AC
Start: 2022-01-07 — End: ?

## 2022-02-16 MED ORDER — NAPROXEN SODIUM 220 MG OR TABS: ORAL_TABLET | ORAL | Status: AC

## 2022-02-16 MED ORDER — OXYCODONE HCL 5 MG OR CAPS
ORAL_CAPSULE | ORAL | Status: AC
Start: 2021-10-24 — End: ?

## 2022-02-16 MED ORDER — NITROFURANTOIN MONOHYD MACRO 100 MG OR CAPS
ORAL_CAPSULE | ORAL | Status: AC
Start: 2022-01-07 — End: ?

## 2022-02-16 MED ORDER — PREDNISOLONE ACETATE 1 % OP SUSP
OPHTHALMIC | Status: AC
Start: 2020-08-03 — End: ?

## 2022-02-16 MED ORDER — RASUVO 7.5 MG/0.15ML SC SOAJ: 0.15 mL | SUBCUTANEOUS | Status: AC

## 2022-02-16 MED ORDER — ALBUTEROL SULFATE 108 (90 BASE) MCG/ACT IN AERS: INHALATION_SPRAY | RESPIRATORY_TRACT | Status: AC

## 2022-02-16 MED ORDER — CYCLOSPORINE 0.05 % OP EMUL
1.00 [drp] | OPHTHALMIC | Status: AC
Start: 2020-08-03 — End: ?

## 2022-02-16 MED ORDER — NALOXONE HCL 4 MG/0.1ML NA LIQD
1.00 | NASAL | Status: AC
Start: 2022-01-01 — End: 2023-01-01

## 2022-02-16 MED ORDER — SERTRALINE HCL 100 MG OR TABS
1.00 | ORAL_TABLET | Freq: Every day | ORAL | Status: AC
Start: 2022-01-26 — End: ?

## 2022-02-16 MED ORDER — CETIRIZINE HCL 5 MG OR TABS: 10.00 mg | ORAL_TABLET | ORAL | Status: AC

## 2022-02-16 MED ORDER — BUDESONIDE-FORMOTEROL FUMARATE 160-4.5 MCG/ACT IN AERO: 2.00 | INHALATION_SPRAY | RESPIRATORY_TRACT | Status: AC

## 2022-02-16 MED ORDER — FENTANYL 25 MCG/HR TD PT72
MEDICATED_PATCH | TRANSDERMAL | Status: AC
Start: 2022-01-03 — End: ?

## 2022-02-16 MED ORDER — ACETAMINOPHEN 500 MG OR TABS: 500.00 mg | ORAL_TABLET | ORAL | Status: AC

## 2022-02-16 MED ORDER — TIOTROPIUM BROMIDE MONOHYDRATE 18 MCG IN CAPS: ORAL_CAPSULE | RESPIRATORY_TRACT | Status: AC

## 2022-02-16 MED ORDER — HYDROCODONE-ACETAMINOPHEN 10-325 MG OR TABS
ORAL_TABLET | ORAL | Status: AC
Start: 2022-02-14 — End: ?

## 2022-02-16 MED ORDER — LEVOTHYROXINE SODIUM 88 MCG OR TABS
88.00 ug | ORAL_TABLET | ORAL | Status: AC
Start: 2020-11-12 — End: ?

## 2022-02-16 MED ORDER — NALOXEGOL OXALATE 12.5 MG PO TABS: 12.50 mg | ORAL_TABLET | ORAL | Status: AC

## 2022-02-16 MED ORDER — ACTEMRA 162 MG/0.9ML SC SOSY: 0.90 mL | PREFILLED_SYRINGE | SUBCUTANEOUS | Status: AC

## 2022-02-18 ENCOUNTER — Ambulatory Visit

## 2022-02-18 ENCOUNTER — Encounter: Payer: Self-pay | Admitting: Orthopaedic Surgery

## 2022-02-18 ENCOUNTER — Ambulatory Visit: Payer: Medicare Other

## 2022-02-20 ENCOUNTER — Ambulatory Visit (HOSPITAL_BASED_OUTPATIENT_CLINIC_OR_DEPARTMENT_OTHER)
Admit: 2022-02-20 | Discharge: 2022-02-20 | Disposition: A | Discharge status: Home Health | Attending: Orthopaedic Surgery | Admitting: Orthopaedic Surgery

## 2022-02-20 ENCOUNTER — Ambulatory Visit
Admit: 2022-02-20 | Discharge: 2022-02-20 | Disposition: A | Discharge status: Home / Self Care | Attending: Diagnostic Radiology | Admitting: Diagnostic Radiology

## 2022-02-20 ENCOUNTER — Ambulatory Visit
Admit: 2022-02-20 | Discharge: 2022-02-20 | Disposition: A | Discharge status: Home Health | Attending: Orthopaedic Surgery | Admitting: Orthopaedic Surgery

## 2022-02-20 ENCOUNTER — Other Ambulatory Visit: Payer: Self-pay

## 2022-02-20 ENCOUNTER — Ambulatory Visit
Admit: 2022-02-20 | Discharge: 2022-02-20 | Disposition: A | Discharge status: Home / Self Care | Payer: Medicare Other | Attending: Diagnostic Radiology | Admitting: Diagnostic Radiology

## 2022-02-20 ENCOUNTER — Ambulatory Visit (HOSPITAL_BASED_OUTPATIENT_CLINIC_OR_DEPARTMENT_OTHER)
Admit: 2022-02-20 | Discharge: 2022-02-20 | Disposition: A | Discharge status: Home Health | Payer: Medicare Other | Attending: Orthopaedic Surgery | Admitting: Orthopaedic Surgery

## 2022-02-20 DIAGNOSIS — I77810 Thoracic aortic ectasia: Secondary | ICD-10-CM

## 2022-02-20 DIAGNOSIS — R911 Solitary pulmonary nodule: Secondary | ICD-10-CM | POA: Insufficient documentation

## 2022-02-20 DIAGNOSIS — M86659 Other chronic osteomyelitis, unspecified thigh: Secondary | ICD-10-CM | POA: Insufficient documentation

## 2022-02-20 DIAGNOSIS — G8929 Other chronic pain: Secondary | ICD-10-CM

## 2022-02-20 DIAGNOSIS — D1809 Hemangioma of other sites: Secondary | ICD-10-CM | POA: Insufficient documentation

## 2022-02-20 DIAGNOSIS — M6258 Muscle wasting and atrophy, not elsewhere classified, other site: Secondary | ICD-10-CM | POA: Insufficient documentation

## 2022-02-20 DIAGNOSIS — J9811 Atelectasis: Secondary | ICD-10-CM | POA: Insufficient documentation

## 2022-02-20 DIAGNOSIS — M899 Disorder of bone, unspecified: Secondary | ICD-10-CM | POA: Insufficient documentation

## 2022-02-20 DIAGNOSIS — M898X0 Other specified disorders of bone, multiple sites: Secondary | ICD-10-CM

## 2022-02-20 DIAGNOSIS — R918 Other nonspecific abnormal finding of lung field: Secondary | ICD-10-CM

## 2022-02-20 DIAGNOSIS — I3139 Other pericardial effusion (noninflammatory): Secondary | ICD-10-CM | POA: Insufficient documentation

## 2022-02-20 MED ORDER — SODIUM CHLORIDE FLUSH 0.9 % IV SOLN
30.0000 mL | Freq: Once | INTRAVENOUS | Status: DC | PRN
Start: 2022-02-20 — End: 2022-02-24

## 2022-02-20 MED ORDER — GADOTERIDOL 279.3 MG/ML IV SOLN
14.0000 mL | Freq: Once | INTRAVENOUS | Status: AC
Start: 2022-02-20 — End: 2022-02-20
  Administered 2022-02-20: 14 mL via INTRAVENOUS

## 2022-02-20 MED ORDER — GADOTERIDOL 279.3 MG/ML IV SOLN
14.00 mL | Freq: Once | INTRAVENOUS | Status: AC
Start: 2022-02-20 — End: 2022-02-20
  Administered 2022-02-20: 14 mL via INTRAVENOUS

## 2022-02-20 MED ORDER — SODIUM CHLORIDE FLUSH 0.9 % IV SOLN
30.00 mL | Freq: Once | INTRAVENOUS | Status: AC | PRN
Start: 2022-02-20 — End: ?

## 2022-02-23 ENCOUNTER — Encounter: Payer: Self-pay | Admitting: Student in an Organized Health Care Education/Training Program

## 2022-02-23 DIAGNOSIS — G8929 Other chronic pain: Secondary | ICD-10-CM

## 2022-03-05 NOTE — Progress Notes (Signed)
Allison David, M.D.  Attending Vascular and Interventional Radiologist  Allison David  Allison David. Allison David, Allison David      Zoom Consultation      Patient: Allison David     Date of Evaluation: 03/06/2022    Referring Provider: Dr. 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     Upon today's video visit, the patient endorses constant severe right hip pain. She cannot sleep at night due to the throbbing pain and manages with CBD, tramadol, and prednisone. She was diagnosed with nonbacterial osteomyelitis three years ago and underwent osteotomy with Allison David. She states that her bone was removed and replaced with cadaver beads. She states that biopsy confirmed there was no infection present but is unsure if the biopsy was positive for cancer. She now has right hip pain located where the surgery was done and had a steroid injection but the pain returned. The pain is usually more severe at night and states that is it only present in the back hip and not in the front. Denies pain at her front right hip upon touch or pain in her left hip. She also endorses left foot neuropathy, she is able to walk only when she does not have pain. Of note, she is able to lay prone.    Patient denies fevers, chills, SOB, chest pain, weakness. + severe right hip pain, left foot neuropathy     Past Medical History:   Active Ambulatory Problems     Diagnosis Date Noted   . Age-related nuclear cataract of both eyes 08/15/2019   . Subcutaneous mass of right thumb 07/23/2020   . Cataract, unspecified cataract type, unspecified laterality 08/03/2020    . Chronic osteomyelitis of pelvic region, unspecified laterality (CMS-HCC) 02/16/2022     Resolved Ambulatory Problems     Diagnosis Date Noted   . No Resolved Ambulatory Problems     Past Medical History:   Diagnosis Date   . Cataract    . Chronic osteomyelitis, pelvic region and thigh (CMS-HCC)    . Dry eye    . Hypothyroidism    . RA (rheumatoid arthritis) (CMS-HCC)        Past Surgical History:  Past Surgical History:   Procedure Laterality Date   . BONE RESECTION Right 2019    [facility]   . CATARACT EXTRACTION Right 09/02/2020       Social History:   Social History     Socioeconomic History   . Marital status: Married   Tobacco Use   . Smoking status: Never   . Smokeless tobacco: Never   Substance and Sexual Activity   . Alcohol use: Never   . Drug use: Never       Family History:   No family history on file.    Allergies & Intolerances:  Cefaclor, Ciprofloxacin, Diagnostic x-ray materials, Fd&c blue #2 [place] lake-guai, Gabapentin, Hydromorphone, Ibuprofen, Meloxicam, Naproxen, Neomycin-polymyxin b gu, Polyethylene glycol, Pseudoephedrine, Sulfamethoxazole w-trimethoprim, Guaifenesin, Homeopathic products, Sulfamethoxazole, Trimethoprim, and Iodine    Home Medications:     Current Outpatient Medications:   .  acetaminophen (TYLENOL) 500  MG tablet, Take 1 tablet (500 mg) by mouth., Disp: , Rfl:   .  albuterol 108 (90 Base) MCG/ACT inhaler, Inhale by mouth., Disp: , Rfl:   .  anterior blue eye kit, USED AS INSTRUCTED BY YOUR PHYSICIAN (KIT FOR RIGHT EYE), Disp: 1 kit, Rfl: 0  .  Azelastine HCl 0.15 % SOLN, 1 drop in each nostril, Disp: , Rfl:   .  Azelastine HCl 137 MCG/SPRAY SOLN, SPRAY 1 SPRAY INTO EACH NOSTRIL TWICE A DAY, Disp: , Rfl:   .  budesonide-formoterol (SYMBICORT) 160-4.5 MCG/ACT inhaler, Inhale 2 puffs by mouth., Disp: , Rfl:   .  budesonide-formoterol (SYMBICORT) 160-4.5 MCG/ACT inhaler, Inhale 2 puffs by mouth., Disp: , Rfl:   .  Carboxymethylcellulose Sodium (THERATEARS) 0.25 % SOLN, Place 1  drop into both eyes daily., Disp: , Rfl:   .  cetirizine (ZYRTEC) 10 MG tablet, Take 1 tablet (10 mg) by mouth daily., Disp: , Rfl:   .  cetirizine (ZYRTEC) 5 MG tablet, Take 2 tablets (10 mg) by mouth., Disp: , Rfl:   .  cycloSPORINE (RESTASIS) 0.05 % ophthalmic emulsion, 1 drop by Ophthalmic route., Disp: , Rfl:   .  cycloSPORINE (RESTASIS) 0.05 % ophthalmic emulsion, Place 1 drop into both eyes 2 times daily., Disp: 2 each, Rfl: 0  .  ergocalciferol (VITAMIN D) 50000 UNIT capsule, TAKE 1 CAPSULE BY MOUTH WEEKLY FOR 8 WEEKS, Disp: , Rfl:   .  fentaNYL (DURAGESIC) 25 MCG/HR patch, PLACE 1 PATCH ONTO THE SKIN EVERY 72 HOURS., Disp: , Rfl:   .  fluorometholone (FML) 0.1 % ophthalmic suspension, Place 1 drop into both eyes 4 times daily., Disp: 1 bottle, Rfl: 0  .  fluticasone propionate (FLONASE) 50 MCG/ACT nasal spray, 1 spray by Nasal route., Disp: , Rfl:   .  fluticasone-salmeterol (WIXELA INHUB) 250-50 MCG/DOSE inhaler, , Disp: , Rfl:   .  folic acid (FOLVITE) 1 MG tablet, 1 tablet (1 mg) by Oral route., Disp: , Rfl:   .  HYDROcodone-acetaminophen (NORCO) 10-325 MG tablet, , Disp: , Rfl:   .  levothyroxine (SYNTHROID) 88 MCG tablet, Take 1 tablet (88 mcg) by mouth., Disp: , Rfl:   .  lidocaine-prilocaine (EMLA) 2.5-2.5 % cream, APPLY 1 APPLICATION ON THE SKIN AS DIRECTED APPLY 10 GRAMS TO SKIN 30 MINUTES PRIOR TO PROCEDURE., Disp: , Rfl:   .  methotrexate (RHEUMATREX) 250 MG/10ML SOLN injection, INJECT 0.8 ML EVERY 7 DAYS--MULTIDOSE VIALS, Disp: , Rfl:   .  methotrexate (TREXALL) 2.5 MG tablet, As Directed, Disp: , Rfl:   .  Methotrexate, PF, (RASUVO) 7.5 MG/0.15ML SOAJ, Inject 0.15 mL under the skin every 7 days., Disp: , Rfl:   .  naloxegol oxalate (MOVANTIK) 12.5 MG TABS tablet, Take 12.5-25 mg by mouth., Disp: , Rfl:   .  naloxone (NARCAN) 4 mg/0.1 mL nasal spray, 1 spray., Disp: , Rfl:   .  naproxen (ALEVE) 220 MG tablet, Take by mouth., Disp: , Rfl:   .  nitrofurantoin monohydrate (MACROBID) 100 MG capsule,  TAKE 1 CAPSULE BY MOUTH EVERY 12 HOURS WITH FOOD, Disp: , Rfl:   .  Olopatadine HCl 0.7 % SOLN, Place 1 drop into both eyes daily., Disp: , Rfl:   .  oxyCODONE (ROXICODONE) 5 MG capsule, TAKE 1 CAPSULE BY MOUTH EVERY 6 HOURS IF NEEDED (PAIN)., Disp: , Rfl:   .  Oyster Shell Calcium 500 MG TABS, 1 tablet, Disp: , Rfl:   .  phenazopyridine (PYRIDIUM) 100 MG tablet,  TAKE 1 TABLET BY MOUTH THREE TIMES A DAY AFTER MEALS AS NEEDED, Disp: , Rfl:   .  prednisoLONE acetate (PRED FORTE) 1 % ophthalmic suspension, PLACE 1 DROP INTO RIGHT EYE 4 TIMES DAILY. START 3 DAYS PRIOR TO SURGERY, Disp: , Rfl:   .  predniSONE (DELTASONE) 5 MG tablet, Take 2 tablets (10 mg) by mouth daily., Disp: , Rfl:   .  PREDNISONE PO, , Disp: , Rfl:   .  sertraline (ZOLOFT) 100 MG tablet, Take 1 tablet (100 mg) by mouth daily., Disp: , Rfl:   .  sertraline (ZOLOFT) 100 MG tablet, Take 1.5 tablets (150 mg) by mouth daily., Disp: , Rfl:   .  SYNTHROID 88 MCG tablet, Take 1 tablet (88 mcg) by mouth daily., Disp: , Rfl:   .  tiotropium (SPIRIVA HANDIHALER) 18 MCG inhalation capsule, Inhale by mouth., Disp: , Rfl:   .  tobramycin (TOBREX) 0.3 % ophthalmic solution, Place 1 drop into right eye every 4 hours. Start 3 days prior to surgery, Disp: 1 each, Rfl: 0  .  tobramycin (TOBREX) 0.3 % ophthalmic solution, Place 1 drop into right eye 4 times daily., Disp: 5 mL, Rfl: 0  .  tobramycin (TOBREX) 0.3 % ophthalmic solution, Place 1 drop into right eye 4 times daily. Start 3 days prior to surgery, Disp: 1 bottle, Rfl: 0  .  tocilizumab (ACTEMRA) 162 MG/0.9ML SOSY injection, Inject 0.9 mL (162 mg) under the skin every 7 days., Disp: , Rfl:   .  XIIDRA 5 % SOLN, , Disp: , Rfl:     Vital Signs: Deferred due to telemedicine.    Physical Exam:  GEN: Patient appears in obvious discomfort, WDWN, NAD, AOx4  HEENT: NCAT. EOMI intact. Sclera is anicteric.  NECK: Supple  LUNGS: No respiratory distress, symmetric expansion, unlabored breathing, speaking in full sentences  without difficulty.  ABDOMEN: Non-distended.   EXTREMITIES: No edema noted.  SKIN: No jaundice.  NEURO: Moves all extremities but walking limited by pain     Lab Results:  No results found for: ALB, ALT, AST, BUN, [place], CL, CHOL, CO2, CREAT, GFR, GFRAA, GFRNON, GLU, HDL, HGB, A1C, LDL, MG, PHOS, PLT, PLCTEL, K, PSA, NA, SODIUM, TRIG, WBC, INR, PT, PTT, AFP    Diagnostic Results:  CT Chest  Exam Date: 02/20/2022  IMPRESSION:    Lucent changes in the T5, T9, T10, and T11 vertebral bodies, nonspecific and may be hemangiomas. Sclerotic changes in the T5 vertebral body, more than expected for degenerative change. Recommend MR thoracic spine without and with intravenous contrast for better evaluation.    6 mm pleural-based right lower lobe nodule.     Few bilateral pleural-based and nonpleural-based micronodules.    Linear scarring/atelectasis in bilateral lower lobes.     Peribronchial wall thickening. Scattered mucous plugging.     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    Small pericardial effusion.    CT Abdomen and Pelvis  Exam Date: 02/20/2022  IMPRESSION:    1.  Multiple sclerotic bone lesions, concerning for metastatic malignancy. Evaluation for neoplastic process is limited by lack of intravenous contrast. No abnormality suspicious for malignancy identified in the abdomen and pelvis at noncontrast CT.    MRI Pelvis Bone  Exam Date: 02/20/2022  IMPRESSION:    Enhancing lesions of the right iliac bone and left sacral ala, which may be related to a chronic infectious process or a neoplastic disease such as lymphoma. Recommend correlation with prior imaging if available  and biopsy if clinically indicated. PET/CT may also provide additional information.    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     The patient has severe right posterior iliac bone pain and may benefit from ablation of the right hip bone. We discussed her MRI which is suspicious for a bony lesion where she has pain and biopsy is recommended. Patient was discussed and images reviewed with Allison David as well who agrees with right iliac bone lesion biopsy with cryoablation. Plan was discussed with patient and plan for right iliac bone lesion biopsy, cryoablation and SI joint injection. Risks were discussed including bleeding, infection, weakened bone or bone fracture, no change in pain, injury to nearby structures or nerves. Questions were answered and patient would like to proceed as soon as possible given her intolerable pain     - Plan for image guided right iliac bone lesion biopsy, cryoablation and sacroiliac joint injection with GA tentatively scheduled for 03/20/2022  - F/U post procedure     Thank you Allison David for involving [place] in the care of this patient. Please do not hesitate to contact us with any questions or concerns.    Due to COVID-19 pandemic and a federally declared state of public health emergency, this telemedicine visit was conducted Audio+Video. The patient was in [place] for the duration of the visit.  The patient consented to a virtual visit, understanding the risks, such as a limited examination, and was aware that an in-person visit was available to them.     Scribe Attestation  The notes I am recording reflect only actions made by and judgments taken by this provider, Allison David, for whom I am scribing today. I have performed no independent clinical work.    Allison David    ____________________________________________________________________    Provider Attestation for Scribed Note    As the attending provider, I agree with the scribed content.  Any changes or edits are noted in the text above. I personally spent 30 minutes of total face-to face and/or non-face-to-face time on the date of service.     Allison Hopping MD

## 2022-03-05 NOTE — Progress Notes (Signed)
Tawny Hopping, M.D.  Attending Vascular and Interventional Radiologist  [facility]  [address] [place], [place] [address]      Zoom Consultation      Patient: Shelly Neal     Date of Evaluation: 03/06/2022    Referring Provider: Dr. 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     Upon today's video visit, the patient endorses constant severe right hip pain. She cannot sleep at night due to the throbbing pain and manages with CBD, tramadol, and prednisone. She was diagnosed with nonbacterial osteomyelitis three years ago and underwent osteotomy with Dr. Bryson Ha. She states that her bone was removed and replaced with cadaver beads. She states that biopsy confirmed there was no infection present but is unsure if the biopsy was positive for cancer. She now has right hip pain located where the surgery was done and had a steroid injection but the pain returned. The pain is usually more severe at night and states that is it only present in the back hip and not in the front. Denies pain at her front right hip upon touch or pain in her left hip. She also endorses left foot neuropathy, she is able to walk only when she does not have pain. Of note, she is able to lay prone.    Patient denies fevers, chills, SOB, chest pain, weakness. + severe right hip pain, left foot neuropathy     Past Medical History:   Active Ambulatory Problems     Diagnosis Date Noted   . Age-related nuclear cataract of both eyes 08/15/2019   . Subcutaneous mass of right thumb 07/23/2020   . Cataract, unspecified cataract type, unspecified laterality 08/03/2020    . Chronic osteomyelitis of pelvic region, unspecified laterality (CMS-HCC) 02/16/2022     Resolved Ambulatory Problems     Diagnosis Date Noted   . No Resolved Ambulatory Problems     Past Medical History:   Diagnosis Date   . Cataract    . Chronic osteomyelitis, pelvic region and thigh (CMS-HCC)    . Dry eye    . Hypothyroidism    . RA (rheumatoid arthritis) (CMS-HCC)        Past Surgical History:  Past Surgical History:   Procedure Laterality Date   . BONE RESECTION Right 2019    [facility]   . CATARACT EXTRACTION Right 09/02/2020       Social History:   Social History     Socioeconomic History   . Marital status: Married   Tobacco Use   . Smoking status: Never   . Smokeless tobacco: Never   Substance and Sexual Activity   . Alcohol use: Never   . Drug use: Never       Family History:   No family history on file.    Allergies & Intolerances:  Cefaclor, Ciprofloxacin, Diagnostic x-ray materials, Fd&c blue #2 al [place]-guai, Gabapentin, Hydromorphone, Ibuprofen, Meloxicam, Naproxen, Neomycin-polymyxin b gu, Polyethylene glycol, Pseudoephedrine, Sulfamethoxazole w-trimethoprim, Guaifenesin, Homeopathic products, Sulfamethoxazole, Trimethoprim, and Iodine    Home Medications:     Current Outpatient Medications:   .  acetaminophen (TYLENOL) 500  MG tablet, Take 1 tablet (500 mg) by mouth., Disp: , Rfl:   .  albuterol 108 (90 Base) MCG/ACT inhaler, Inhale by mouth., Disp: , Rfl:   .  anterior blue eye kit, USED AS INSTRUCTED BY YOUR PHYSICIAN (KIT FOR RIGHT EYE), Disp: 1 kit, Rfl: 0  .  Azelastine HCl 0.15 % SOLN, 1 drop in each nostril, Disp: , Rfl:   .  Azelastine HCl 137 MCG/SPRAY SOLN, SPRAY 1 SPRAY INTO EACH NOSTRIL TWICE A DAY, Disp: , Rfl:   .  budesonide-formoterol (SYMBICORT) 160-4.5 MCG/ACT inhaler, Inhale 2 puffs by mouth., Disp: , Rfl:   .  budesonide-formoterol (SYMBICORT) 160-4.5 MCG/ACT inhaler, Inhale 2 puffs by mouth., Disp: , Rfl:   .  Carboxymethylcellulose Sodium (THERATEARS) 0.25 % SOLN, Place 1  drop into both eyes daily., Disp: , Rfl:   .  cetirizine (ZYRTEC) 10 MG tablet, Take 1 tablet (10 mg) by mouth daily., Disp: , Rfl:   .  cetirizine (ZYRTEC) 5 MG tablet, Take 2 tablets (10 mg) by mouth., Disp: , Rfl:   .  cycloSPORINE (RESTASIS) 0.05 % ophthalmic emulsion, 1 drop by Ophthalmic route., Disp: , Rfl:   .  cycloSPORINE (RESTASIS) 0.05 % ophthalmic emulsion, Place 1 drop into both eyes 2 times daily., Disp: 2 each, Rfl: 0  .  ergocalciferol (VITAMIN D) 50000 UNIT capsule, TAKE 1 CAPSULE BY MOUTH WEEKLY FOR 8 WEEKS, Disp: , Rfl:   .  fentaNYL (DURAGESIC) 25 MCG/HR patch, PLACE 1 PATCH ONTO THE SKIN EVERY 72 HOURS., Disp: , Rfl:   .  fluorometholone (FML) 0.1 % ophthalmic suspension, Place 1 drop into both eyes 4 times daily., Disp: 1 bottle, Rfl: 0  .  fluticasone propionate (FLONASE) 50 MCG/ACT nasal spray, 1 spray by Nasal route., Disp: , Rfl:   .  fluticasone-salmeterol (WIXELA INHUB) 250-50 MCG/DOSE inhaler, , Disp: , Rfl:   .  folic acid (FOLVITE) 1 MG tablet, 1 tablet (1 mg) by Oral route., Disp: , Rfl:   .  HYDROcodone-acetaminophen (NORCO) 10-325 MG tablet, , Disp: , Rfl:   .  levothyroxine (SYNTHROID) 88 MCG tablet, Take 1 tablet (88 mcg) by mouth., Disp: , Rfl:   .  lidocaine-prilocaine (EMLA) 2.5-2.5 % cream, APPLY 1 APPLICATION ON THE SKIN AS DIRECTED APPLY 10 GRAMS TO SKIN 30 MINUTES PRIOR TO PROCEDURE., Disp: , Rfl:   .  methotrexate (RHEUMATREX) 250 MG/10ML SOLN injection, INJECT 0.8 ML EVERY 7 DAYS--MULTIDOSE VIALS, Disp: , Rfl:   .  methotrexate (TREXALL) 2.5 MG tablet, As Directed, Disp: , Rfl:   .  Methotrexate, PF, (RASUVO) 7.5 MG/0.15ML SOAJ, Inject 0.15 mL under the skin every 7 days., Disp: , Rfl:   .  naloxegol oxalate (MOVANTIK) 12.5 MG TABS tablet, Take 12.5-25 mg by mouth., Disp: , Rfl:   .  naloxone (NARCAN) 4 mg/0.1 mL nasal spray, 1 spray., Disp: , Rfl:   .  naproxen (ALEVE) 220 MG tablet, Take by mouth., Disp: , Rfl:   .  nitrofurantoin monohydrate (MACROBID) 100 MG capsule,  TAKE 1 CAPSULE BY MOUTH EVERY 12 HOURS WITH FOOD, Disp: , Rfl:   .  Olopatadine HCl 0.7 % SOLN, Place 1 drop into both eyes daily., Disp: , Rfl:   .  oxyCODONE (ROXICODONE) 5 MG capsule, TAKE 1 CAPSULE BY MOUTH EVERY 6 HOURS IF NEEDED (PAIN)., Disp: , Rfl:   .  Oyster Shell Calcium 500 MG TABS, 1 tablet, Disp: , Rfl:   .  phenazopyridine (PYRIDIUM) 100 MG tablet,  TAKE 1 TABLET BY MOUTH THREE TIMES A DAY AFTER MEALS AS NEEDED, Disp: , Rfl:   .  prednisoLONE acetate (PRED FORTE) 1 % ophthalmic suspension, PLACE 1 DROP INTO RIGHT EYE 4 TIMES DAILY. START 3 DAYS PRIOR TO SURGERY, Disp: , Rfl:   .  predniSONE (DELTASONE) 5 MG tablet, Take 2 tablets (10 mg) by mouth daily., Disp: , Rfl:   .  PREDNISONE PO, , Disp: , Rfl:   .  sertraline (ZOLOFT) 100 MG tablet, Take 1 tablet (100 mg) by mouth daily., Disp: , Rfl:   .  sertraline (ZOLOFT) 100 MG tablet, Take 1.5 tablets (150 mg) by mouth daily., Disp: , Rfl:   .  SYNTHROID 88 MCG tablet, Take 1 tablet (88 mcg) by mouth daily., Disp: , Rfl:   .  tiotropium (SPIRIVA HANDIHALER) 18 MCG inhalation capsule, Inhale by mouth., Disp: , Rfl:   .  tobramycin (TOBREX) 0.3 % ophthalmic solution, Place 1 drop into right eye every 4 hours. Start 3 days prior to surgery, Disp: 1 each, Rfl: 0  .  tobramycin (TOBREX) 0.3 % ophthalmic solution, Place 1 drop into right eye 4 times daily., Disp: 5 mL, Rfl: 0  .  tobramycin (TOBREX) 0.3 % ophthalmic solution, Place 1 drop into right eye 4 times daily. Start 3 days prior to surgery, Disp: 1 bottle, Rfl: 0  .  tocilizumab (ACTEMRA) 162 MG/0.9ML SOSY injection, Inject 0.9 mL (162 mg) under the skin every 7 days., Disp: , Rfl:   .  XIIDRA 5 % SOLN, , Disp: , Rfl:     Vital Signs: Deferred due to telemedicine.    Physical Exam:  GEN: Patient appears in obvious discomfort, WDWN, NAD, AOx4  HEENT: NCAT. EOMI intact. Sclera is anicteric.  NECK: Supple  LUNGS: No respiratory distress, symmetric expansion, unlabored breathing, speaking in full sentences  without difficulty.  ABDOMEN: Non-distended.   EXTREMITIES: No edema noted.  SKIN: No jaundice.  NEURO: Moves all extremities but walking limited by pain     Lab Results:  No results found for: ALB, ALT, AST, BUN, [place], CL, CHOL, CO2, CREAT, GFR, GFRAA, GFRNON, GLU, HDL, HGB, A1C, LDL, MG, PHOS, PLT, PLCTEL, K, PSA, NA, SODIUM, TRIG, WBC, INR, PT, PTT, AFP    Diagnostic Results:  CT Chest  Exam Date: 02/20/2022  IMPRESSION:    Lucent changes in the T5, T9, T10, and T11 vertebral bodies, nonspecific and may be hemangiomas. Sclerotic changes in the T5 vertebral body, more than expected for degenerative change. Recommend MR thoracic spine without and with intravenous contrast for better evaluation.    6 mm pleural-based right lower lobe nodule.     Few bilateral pleural-based and nonpleural-based micronodules.    Linear scarring/atelectasis in bilateral lower lobes.     Peribronchial wall thickening. Scattered mucous plugging.     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    Small pericardial effusion.    CT Abdomen and Pelvis  Exam Date: 02/20/2022  IMPRESSION:    1.  Multiple sclerotic bone lesions, concerning for metastatic malignancy. Evaluation for neoplastic process is limited by lack of intravenous contrast. No abnormality suspicious for malignancy identified in the abdomen and pelvis at noncontrast CT.    MRI Pelvis Bone  Exam Date: 02/20/2022  IMPRESSION:    Enhancing lesions of the right iliac bone and left sacral ala, which may be related to a chronic infectious process or a neoplastic disease such as lymphoma. Recommend correlation with prior imaging if available   and biopsy if clinically indicated. PET/CT may also provide additional information.    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     The patient has severe right posterior iliac bone pain and may benefit from ablation of the right hip bone. We discussed her MRI which is suspicious for a bony lesion where she has pain and biopsy is recommended. Patient was discussed and images reviewed with Dr. Towanda Octave as well who agrees with right iliac bone lesion biopsy with cryoablation. Plan was discussed with patient and plan for right iliac bone lesion biopsy, cryoablation and SI joint injection. Risks were discussed including bleeding, infection, weakened bone or bone fracture, no change in pain, injury to nearby structures or nerves. Questions were answered and patient would like to proceed as soon as possible given her intolerable pain     - Plan for image guided right iliac bone lesion biopsy, cryoablation and sacroiliac joint injection with GA tentatively scheduled for 03/20/2022  - F/U post procedure     Thank you Dr. Raelene Bott for involving [place] in the care of this patient. Please do not hesitate to contact us with any questions or concerns.    Due to COVID-19 pandemic and a federally declared state of public health emergency, this telemedicine visit was conducted Audio+Video. The patient was in [place] for the duration of the visit.  The patient consented to a virtual visit, understanding the risks, such as a limited examination, and was aware that an in-person visit was available to them.     Scribe Attestation  The notes I am recording reflect only actions made by and judgments taken by this provider, Dr. Luciana Axe, for whom I am scribing today. I have performed no independent clinical work.    Imagene Sheller    ____________________________________________________________________    Provider Attestation for Scribed Note    As the attending provider, I agree with the scribed content.  Any changes or edits are noted in the text above. I personally spent 30 minutes of total face-to face and/or non-face-to-face time on the date of service.     Tawny Hopping MD

## 2022-03-06 ENCOUNTER — Telehealth: Payer: Self-pay

## 2022-03-06 ENCOUNTER — Telehealth: Admitting: Interventional Radiology

## 2022-03-06 ENCOUNTER — Telehealth: Payer: Medicare Other | Admitting: Interventional Radiology

## 2022-03-06 DIAGNOSIS — M533 Sacrococcygeal disorders, not elsewhere classified: Secondary | ICD-10-CM

## 2022-03-06 DIAGNOSIS — M898X8 Other specified disorders of bone, other site: Secondary | ICD-10-CM

## 2022-03-06 DIAGNOSIS — M898X9 Other specified disorders of bone, unspecified site: Secondary | ICD-10-CM

## 2022-03-06 DIAGNOSIS — Z01810 Encounter for preprocedural cardiovascular examination: Secondary | ICD-10-CM

## 2022-03-06 NOTE — Telephone Encounter (Signed)
EKG order placed for patient procedure.

## 2022-03-06 NOTE — Telephone Encounter (Signed)
Called patient to schedule IR procedure, left detailed voicemail to call me back at   [phone] to schedule IR PROCEDURE

## 2022-03-06 NOTE — Telephone Encounter (Signed)
EKG order placed for patient procedure.

## 2022-03-09 ENCOUNTER — Ambulatory Visit: Admitting: Orthopaedic Surgery

## 2022-03-09 ENCOUNTER — Ambulatory Visit: Payer: Medicare Other | Attending: Orthopaedic Surgery | Admitting: Orthopaedic Surgery

## 2022-03-09 ENCOUNTER — Other Ambulatory Visit
Admission: RE | Admit: 2022-03-09 | Discharge: 2022-03-09 | Disposition: A | Discharge status: Home Health | Payer: Medicare Other

## 2022-03-09 ENCOUNTER — Encounter: Payer: Self-pay | Admitting: Orthopaedic Surgery

## 2022-03-09 ENCOUNTER — Ambulatory Visit: Payer: Medicare Other

## 2022-03-09 ENCOUNTER — Other Ambulatory Visit: Payer: Self-pay | Admitting: Interventional Radiology

## 2022-03-09 ENCOUNTER — Ambulatory Visit (HOSPITAL_BASED_OUTPATIENT_CLINIC_OR_DEPARTMENT_OTHER)
Admission: RE | Admit: 2022-03-09 | Discharge: 2022-03-09 | Disposition: A | Discharge status: Home Health | Payer: Medicare Other

## 2022-03-09 VITALS — BP 130/62 | HR 78 | Resp 20 | Ht 69.0 in | Wt 162.3 lb

## 2022-03-09 DIAGNOSIS — M898X9 Other specified disorders of bone, unspecified site: Secondary | ICD-10-CM

## 2022-03-09 DIAGNOSIS — Z01810 Encounter for preprocedural cardiovascular examination: Secondary | ICD-10-CM | POA: Insufficient documentation

## 2022-03-09 DIAGNOSIS — Z6823 Body mass index (BMI) 23.0-23.9, adult: Secondary | ICD-10-CM

## 2022-03-09 DIAGNOSIS — R9431 Abnormal electrocardiogram [ECG] [EKG]: Secondary | ICD-10-CM | POA: Insufficient documentation

## 2022-03-09 DIAGNOSIS — M898X8 Other specified disorders of bone, other site: Secondary | ICD-10-CM | POA: Insufficient documentation

## 2022-03-09 DIAGNOSIS — M86659 Other chronic osteomyelitis, unspecified thigh: Secondary | ICD-10-CM | POA: Insufficient documentation

## 2022-03-09 DIAGNOSIS — M533 Sacrococcygeal disorders, not elsewhere classified: Secondary | ICD-10-CM | POA: Insufficient documentation

## 2022-03-09 LAB — ECG 12-LEAD
QT: 429 ms
QTC INTERVAL: 423 ms
R AXIS: -88 Deg
R-R INTERVAL AVERAGE: 1043 ms
T AXIS: 65 Deg

## 2022-03-09 LAB — PT/INR/PTT
INR: 0.93 (ref 0.89–1.11)
PTT: 25 s (ref 24.2–36.7)
Prothrombin Time: 12.4 s (ref 12.0–14.2)

## 2022-03-09 MED ORDER — ACETAMINOPHEN 325 MG PO CAPS
650.0000 mg | ORAL_CAPSULE | ORAL | Status: DC
Start: 2021-11-20 — End: 2022-03-19

## 2022-03-09 MED ORDER — METHYLPREDNISOLONE SODIUM SUCC 2000 MG IJ SOLR
125.0000 mg | INTRAMUSCULAR | Status: DC
Start: 2021-11-20 — End: 2022-03-19

## 2022-03-09 MED ORDER — ACTEMRA 200 MG/10ML IV SOLN
619.0000 mg | INTRAVENOUS | Status: DC
Start: 2021-12-18 — End: 2022-03-19

## 2022-03-09 MED ORDER — TRAMADOL HCL 50 MG OR TABS
ORAL_TABLET | ORAL | Status: DC
Start: 2022-02-13 — End: 2022-03-19

## 2022-03-09 NOTE — Progress Notes (Signed)
Shelly Neal. Shelly Neal, M.D.  Assistant Clinical Professor of Orthopaedic Surgery  Orthopaedic Trauma and Hip Preservation  [employer], [place], [place]  [place], [place]    [phone]   [email or web address]  [birth year]  [id number]      CC: "Nonbacterial Osteomyelitis of the pelvis"    HPI: 76 year old female with a history of complex pelvic pain issues, including previous surgery for "nonbacterial osteomyelitis of the pelvis." The patient presents with her husband today. She complains of continued severe pain, which keeps her up at night. She emphasizes that her pain is severe and affects her ADLs. She states Dr. Ebony Hail at [facility] does not think it is ?CROM (?chronic recurrent osteomyelitis). Takes Tylenol 500 mg q/6hr, Tramadol BID, and CBD BID. Also takes Prednisone 10-15 mg daily x/70month Also takes Methotrexate and Actemra. She reports improvement with Actemra when her CSR was elevated. She was formerly followed by Dr. HWarnell Foresterfor Pain management, but is now followed by Dr. BLuciana Axe       Past Medical History:   Diagnosis Date   . Cataract    . Chronic osteomyelitis, pelvic region and thigh (CMS-HCC)    . Dry eye    . Hypothyroidism    . RA (rheumatoid arthritis) (CMS-HCC)        Past Surgical History:   Procedure Laterality Date   . CATARACT EXTRACTION Right 09/02/2020   . [facility]      No family history on file.    Social History     Socioeconomic History   . Marital status: Married     Spouse name: Not on file   . Number of children: Not on file   . Years of education: Not on file   . Highest education level: Not on file   Occupational History   . Not on file   Tobacco Use   . Smoking status: Never   . Smokeless tobacco: Never   Substance and Sexual Activity   . Alcohol use: Never   . Drug use: Never   . Sexual activity: Not on file   Other Topics Concern   . Not on file   Social History Narrative    . Not on file     Social Determinants of Health     Financial Resource Strain: Not on file   Food Insecurity: Not on file   Transportation Needs: Not on file   Physical Activity: Not on file   Stress: Not on file   Social Connections: Not on file   Intimate Partner Violence: Not on file   Housing Stability: Not on file         Current Outpatient Medications:   .  acetaminophen (TYLENOL) 500 MG tablet, Take 1 tablet (500 mg) by mouth., Disp: , Rfl:   .  albuterol 108 (90 Base) MCG/ACT inhaler, Inhale by mouth., Disp: , Rfl:   .  anterior blue eye kit, USED AS INSTRUCTED BY YOUR PHYSICIAN (KIT FOR RIGHT EYE), Disp: 1 kit, Rfl: 0  .  Azelastine HCl 0.15 % SOLN, 1 drop in each nostril, Disp: , Rfl:   .  Azelastine HCl 137 MCG/SPRAY SOLN, SPRAY 1 SPRAY INTO EACH NOSTRIL TWICE A DAY, Disp: , Rfl:   .  budesonide-formoterol (SYMBICORT) 160-4.5 MCG/ACT inhaler, Inhale 2 puffs by mouth., Disp: , Rfl:   .  budesonide-formoterol (  SYMBICORT) 160-4.5 MCG/ACT inhaler, Inhale 2 puffs by mouth., Disp: , Rfl:   .  Carboxymethylcellulose Sodium (THERATEARS) 0.25 % SOLN, Place 1 drop into both eyes daily., Disp: , Rfl:   .  cetirizine (ZYRTEC) 10 MG tablet, Take 1 tablet (10 mg) by mouth daily., Disp: , Rfl:   .  cetirizine (ZYRTEC) 5 MG tablet, Take 2 tablets (10 mg) by mouth., Disp: , Rfl:   .  cycloSPORINE (RESTASIS) 0.05 % ophthalmic emulsion, 1 drop by Ophthalmic route., Disp: , Rfl:   .  cycloSPORINE (RESTASIS) 0.05 % ophthalmic emulsion, Place 1 drop into both eyes 2 times daily., Disp: 2 each, Rfl: 0  .  ergocalciferol (VITAMIN D) 50000 UNIT capsule, TAKE 1 CAPSULE BY MOUTH WEEKLY FOR 8 WEEKS, Disp: , Rfl:   .  fentaNYL (DURAGESIC) 25 MCG/HR patch, PLACE 1 PATCH ONTO THE SKIN EVERY 72 HOURS., Disp: , Rfl:   .  fluorometholone (FML) 0.1 % ophthalmic suspension, Place 1 drop into both eyes 4 times daily., Disp: 1 bottle, Rfl: 0  .  fluticasone propionate (FLONASE) 50 MCG/ACT nasal spray, 1 spray by Nasal route., Disp: , Rfl:    .  fluticasone-salmeterol (WIXELA INHUB) 250-50 MCG/DOSE inhaler, , Disp: , Rfl:   .  folic acid (FOLVITE) 1 MG tablet, 1 tablet (1 mg) by Oral route., Disp: , Rfl:   .  HYDROcodone-acetaminophen (NORCO) 10-325 MG tablet, , Disp: , Rfl:   .  levothyroxine (SYNTHROID) 88 MCG tablet, Take 1 tablet (88 mcg) by mouth., Disp: , Rfl:   .  lidocaine-prilocaine (EMLA) 2.5-2.5 % cream, APPLY 1 APPLICATION ON THE SKIN AS DIRECTED APPLY 10 GRAMS TO SKIN 30 MINUTES PRIOR TO PROCEDURE., Disp: , Rfl:   .  methotrexate (RHEUMATREX) 250 MG/10ML SOLN injection, INJECT 0.8 ML EVERY 7 DAYS--MULTIDOSE VIALS, Disp: , Rfl:   .  methotrexate (TREXALL) 2.5 MG tablet, As Directed, Disp: , Rfl:   .  Methotrexate, PF, (RASUVO) 7.5 MG/0.15ML SOAJ, Inject 0.15 mL under the skin every 7 days., Disp: , Rfl:   .  naloxegol oxalate (MOVANTIK) 12.5 MG TABS tablet, Take 12.5-25 mg by mouth., Disp: , Rfl:   .  naloxone (NARCAN) 4 mg/0.1 mL nasal spray, 1 spray., Disp: , Rfl:   .  naproxen (ALEVE) 220 MG tablet, Take by mouth., Disp: , Rfl:   .  nitrofurantoin monohydrate (MACROBID) 100 MG capsule, TAKE 1 CAPSULE BY MOUTH EVERY 12 HOURS WITH FOOD, Disp: , Rfl:   .  Olopatadine HCl 0.7 % SOLN, Place 1 drop into both eyes daily., Disp: , Rfl:   .  oxyCODONE (ROXICODONE) 5 MG capsule, TAKE 1 CAPSULE BY MOUTH EVERY 6 HOURS IF NEEDED (PAIN)., Disp: , Rfl:   .  Oyster Shell Calcium 500 MG TABS, 1 tablet, Disp: , Rfl:   .  phenazopyridine (PYRIDIUM) 100 MG tablet, TAKE 1 TABLET BY MOUTH THREE TIMES A DAY AFTER MEALS AS NEEDED, Disp: , Rfl:   .  prednisoLONE acetate (PRED FORTE) 1 % ophthalmic suspension, PLACE 1 DROP INTO RIGHT EYE 4 TIMES DAILY. START 3 DAYS PRIOR TO SURGERY, Disp: , Rfl:   .  predniSONE (DELTASONE) 5 MG tablet, Take 2 tablets (10 mg) by mouth daily., Disp: , Rfl:   .  PREDNISONE PO, , Disp: , Rfl:   .  sertraline (ZOLOFT) 100 MG tablet, Take 1 tablet (100 mg) by mouth daily., Disp: , Rfl:   .  sertraline (ZOLOFT) 100 MG tablet, Take 1.5  tablets (150 mg)  by mouth daily., Disp: , Rfl:   .  SYNTHROID 88 MCG tablet, Take 1 tablet (88 mcg) by mouth daily., Disp: , Rfl:   .  tiotropium (SPIRIVA HANDIHALER) 18 MCG inhalation capsule, Inhale by mouth., Disp: , Rfl:   .  tobramycin (TOBREX) 0.3 % ophthalmic solution, Place 1 drop into right eye every 4 hours. Start 3 days prior to surgery, Disp: 1 each, Rfl: 0  .  tobramycin (TOBREX) 0.3 % ophthalmic solution, Place 1 drop into right eye 4 times daily., Disp: 5 mL, Rfl: 0  .  tobramycin (TOBREX) 0.3 % ophthalmic solution, Place 1 drop into right eye 4 times daily. Start 3 days prior to surgery, Disp: 1 bottle, Rfl: 0  .  tocilizumab (ACTEMRA) 162 MG/0.9ML SOSY injection, Inject 0.9 mL (162 mg) under the skin every 7 days., Disp: , Rfl:   .  XIIDRA 5 % SOLN, , Disp: , Rfl:     Allergies  Allergies   Allergen Reactions   . Cefaclor Hives     Other reaction(s): Pruritic rash (disorder)  Other reaction(s): Pruritic rash (disorder)  No problems with amoxicillin/clavulanate - last taken October 2019  Other reaction(s): Pruritic rash (disorder)  Other reaction(s): Pruritic rash (disorder)  Other reaction(s): Pruritic rash (disorder)    Other reaction(s): Unknown  Other reaction(s): Pruritic rash (disorder)  Other reaction(s): Pruritic rash (disorder)  Other reaction(s): Pruritic rash (disorder)  Other reaction(s): Pruritic rash (disorder)  No problems with amoxicillin/clavulanate - last taken October 2019  Other reaction(s): Pruritic rash (disorder)  Other reaction(s): Pruritic rash (disorder)  No problems with amoxicillin/clavulanate - last taken October 2019  Other reaction(s): Pruritic rash (disorder)  Other reaction(s): Pruritic rash (disorder)  Other reaction(s): Pruritic rash (disorder)    No problems with amoxicillin/clavulanate - last taken October 2019  Other reaction(s): Pruritic rash (disorder)  No problems with amoxicillin/clavulanate - last taken October 2019  No problems with amoxicillin/clavulanate  - last taken October 2019  Other reaction(s): Pruritic rash (disorder)  No problems with amoxicillin/clavulanate - last taken October 2019  No problems with amoxicillin/clavulanate - last taken October 2019  Other reaction(s): Pruritic rash (disorder)  No problems with amoxicillin/clavulanate - last taken October 2019     . Ciprofloxacin Rash   . Diagnostic X-Ray Materials Rash   . Fd&C Blue #2 [place] Rash   . Gabapentin Nausea and Vomiting   . Hydromorphone Rash     pruritic rash to left arm - no shortness of breath  pruritic rash to left arm - no shortness of breath  pruritic rash to left arm - no shortness of breath  pruritic rash to left arm - no shortness of breath  pruritic rash to left arm - no shortness of breath    pruritic rash to left arm - no shortness of breath  pruritic rash to left arm - no shortness of breath  pruritic rash to left arm - no shortness of breath  pruritic rash to left arm - no shortness of breath  pruritic rash to left arm - no shortness of breath  pruritic rash to left arm - no shortness of breath  pruritic rash to left arm - no shortness of breath  pruritic rash to left arm - no shortness of breath  pruritic rash to left arm - no shortness of breath  pruritic rash to left arm - no shortness of breath  pruritic rash to left arm - no shortness of breath  pruritic rash to left  arm - no shortness of breath  pruritic rash to left arm - no shortness of breath     . Ibuprofen Hives and Rash     Aleve    Aleve  Aleve  Aleve  Aleve  Aleve     . Meloxicam Rash   . Naproxen Rash   . Neomycin-Polymyxin B Gu Rash   . Polyethylene Glycol Hives     Other reaction(s): Pruritic rash (disorder)  Other reaction(s): Pruritic rash (disorder)  Other reaction(s): Pruritic rash (disorder)  Other reaction(s): Pruritic rash (disorder)    Other reaction(s): Pruritic rash (disorder)  Other reaction(s): Pruritic rash (disorder)  Other reaction(s): Pruritic rash (disorder)  Other reaction(s): Pruritic rash  (disorder)  Other reaction(s): Pruritic rash (disorder)  Other reaction(s): Pruritic rash (disorder)  Other reaction(s): Pruritic rash (disorder)  Other reaction(s): Pruritic rash (disorder)  Other reaction(s): Pruritic rash (disorder)  Other reaction(s): Pruritic rash (disorder)  Other reaction(s): Pruritic rash (disorder)     . Pseudoephedrine Rash   . Sulfamethoxazole W-Trimethoprim Hives and Rash     Other reaction(s): Pruritic rash (disorder)  Other reaction(s): Pruritic rash (disorder)    Other reaction(s): Unknown  Other reaction(s): Pruritic rash (disorder)  Other reaction(s): Pruritic rash (disorder)  Other reaction(s): Pruritic rash (disorder)  Other reaction(s): Pruritic rash (disorder)  Other reaction(s): Pruritic rash (disorder)  Other reaction(s): Pruritic rash (disorder)  Other reaction(s): Pruritic rash (disorder)    Other reaction(s): Pruritic rash (disorder)  Other reaction(s): Pruritic rash (disorder)    Other reaction(s): Pruritic rash (disorder)  Other reaction(s): Pruritic rash (disorder)  Other reaction(s): Pruritic rash (disorder)     . Guaifenesin Unspecified   . Homeopathic Products Other   . Sulfamethoxazole Other   . Trimethoprim Other   . Iodine Other       Review of Systems  13 point review of systems negative unless otherwise noted in the HPI.    PE  Vitals:  BP 130/62   Pulse 78   Resp 20   Ht 5' 9"  (1.753 m)   Wt 73.6 kg (162 lb 4.1 oz)   BMI 23.96 kg/m   General  - Normocephalic, atraumatic. No acute distress.   Psych  - A&O x3, normal questions/judgement  Pulmonary  - Speaks in full sentences, no retractions  Right Lower Extremity:  Skin: Intact, healed incision  Hip   Limited by pain  Knee   normal  Ankle   normal      Imaging  MRI of the Pelvis obtained 02/20/22 and interpreted by me show signal changes in anterior/posterior right ilium, left sacrum  CT of the Chest/Abdomen/Pelvis WO contrast obtained 02/20/22 show no obvious primary lesion.    A/P  76 year old female with a  history of complex pelvic pain issues, including previous surgery for "nonbacterial osteomyelitis of the pelvis." The etiology of the patient's pain remains unclear. Patient reported that Dr. Ebony Hail at [facility] [place] "removed a lot of bone," however imaging does not corroborate extensive bone loss. Imaging shows some abnormality of right pelvic bone, but a definitive diagnosis cannot be determined. I again had a lengthy discussion with the patient and her husband regarding the possible etiology of her pain and treatment options. She has a right iliac bone mass biopsy, cryoablation and SI joint CSI scheduled with Dr. Luciana Axe on 03/20/22. I instructed the patient to log her pain levels every hour the day of her injection and every day afterward for 4 weeks. I will also contact radiology regarding the  Skeletal Survey performed on 02/16/22. We will await results from Dr. Merilyn Baba biopsy/injection and a report of the skeletal survey for further evaluation.     - WBAT BLE  - Contact radiology for Skeletal Survey report  - Return to clinic 3 weeks.    I have personally reviewed and interpreted the patient's prior diagnostic testing, relevant imaging, and external notes from their other treating physicians in the process of formulating my assessment and plan of care for their current symptoms and pathology.    I have also personally discussed the patient's diagnostic testing, relevant imaging, and my assessment and plan with the patient's relevant external physicians and healthcare providers as needed    Scribe Attestation  The notes I am recording reflect only actions made by and judgments taken by this provider, Dr. Elgie Congo, for whom I am scribing today.  I have performed no independent clinical work.    Armandina Stammer    ____________________________________________________________________    Provider Attestation for Scribed Note    As the attending provider, I agree with the scribed content.  Any changes or edits are noted in  the text above.     [place]

## 2022-03-09 NOTE — Telephone Encounter (Signed)
Called patient to schedule IR procedure, left detailed voicemail to call me back at   [phone] to schedule IR PROCEDURE

## 2022-03-10 LAB — ECG 12-LEAD
P AXIS: 98 Deg
PR INTERVAL: 161 ms
QRS INTERVAL/DURATION: 98 ms
VENTRICULAR RATE: 57 {beats}/min

## 2022-03-11 ENCOUNTER — Ambulatory Visit: Payer: Medicare Other

## 2022-03-11 NOTE — Anesthesia Preprocedure Evaluation (Addendum)
ANESTHESIA PRE-OPERATIVE EVALUATION    Patient Information    Name: Shelly Neal    MRN: [id number]    DOB: Aug 29, 1946    Age: 76 year old    Sex: female  Procedure(s) with comments:  Right iliac bone mass biopsy, cryoablation and SI joint injection - 6/23:Please schedule 7/7 or earlier if cancellations   CT 4hr (including anesthesia);GA ;Prone right;Possible 23hr obs  CBC, CMP (re-draw if scheduled after 7/1)  PT/INR/PTT @ [facility] (Please have pt. Draw [address])  EKG: Ordered @ [facility] (Please have pt complete @ [facility] Cardiology)  PSQ: MyChart  Hold NSAIDS (Naproxen) for 5 days prior to procedure  Take pain med in AM with little water  Covid swab 1 day prior  1030 CHECK IN      Pre-op Vitals:   See Vitals Flowsheets for details.        Primary language spoken:  English    ROS/Medical History:       History of Present Illness: 76 year old female w/Bone mass; Iliac crest bone pain; Pain of right sacroiliac joint     Special Considerations: Difficulty placing IV line in the past -Yes     General:  negative for General ROS  Can do light to moderate activities ([facility] < 4)- activity limited by pain- per psq 03/11/22  Gait Aid Usage: Walker     Negative social hx Cardiovascular:  dysrhythmias,  03/25/2020 Cardiology-Dr Bruss note (media):  #Palpitaions; present for ~24month  Denies CP, sob, PND, orthopnea, peripheral swelling.  2 week event monitor.   ECHO.      ECHO 04/15/2020 (media):   EF 64%  LVSF is nml  Mild AR  Ascending aorta is mildly enlarged; aortic root 3.0 cm  Trace TR    EKG 03/09/22 epic: SB HR 57; Incomplete RBBB; abnml    H/o Chronic hypotension (CE)   Anesthesia History:  History of anesthetic complications:    PONV,  history of difficult IV access,  chronic pain patient (Chronic right hip pain),  no family history of anesthetic complications,    PSHx:  -Tonsillectomy  -Left femur surgery-tumor removed  -Right foot surgery  -Breast enhancement surgery and removal  -Nephrostomy  -1963: Appendectomy  -1985:  Hysterectomy  -2019: Bone resection (Right)   -09/02/2020: Cataract extraction (Right)  -09/23/2020: Cataract extraction w/IOL Left eye- MAC Pulmonary:   asthma,  sleep apnea (OSA (CE)),  no recent URI/pneumonia,    Can safely lie flat on back for an hour-Yes; per psq 03/11/22  +loud snore    12/10/21 CT Chest (CE):  1. Multiple subcentimeter bilateral pulmonary nodules. 2 nodules on the right have resolved while there is a new subcentimeter nodule on the left.   2. 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   -Vessels: There is stable ectasia involving the ascending thoracic aorta to a caliber of 3.8 cm. There are scattered atherosclerotic calcifications.     02/20/22 CT Chest epic:  -Lucent changes in the T5, T9, T10, and T11 vertebral bodies, nonspecific and may be hemangiomas. Sclerotic changes in the T5 vertebral body, more than expected for degenerative change. Recommend MR thoracic spine without and w/intravenous contrast for better eval.  -6 mm pleural-based right lower lobe nodule.   -Few bilateral pleural-based and nonpleural-based micronodules.  -Linear scarring/atelectasis in bilateral lower lobes.   -Peribronchial wall thickening. Scattered mucous plugging.   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  -Small pericardial effusion.   -Chronic findings, as above.  Neuro/Psych:   negative for TIA/CVA,  no seizures,  psychiatric history (anxiety (CE)),    Dry eye syndrome   H/o Cataract  Peripheral polyneuropathy (CE)  Uses hearing aid (CE) Hematology/Oncology:   history of cancer,  H/o Multiple myeloma  H/o Neoplasm of uncertain behavior of trachea, bronchus and lung    12/24/21 Hem/Onc note (media):  #Monoclonal gammopathy (IgM kappa); on conservative monitoring.  #Hypogammaglobulinemia (low IgG); unclear etiology; poss d/t immunosuppression for RA.   #Lung nodules.   -CT Chest 12/26/20 showed previously seen RLL and LLL pulmonary nodules are grossly unchanged (compared to 06/23/19,  06/28/18), however showed 4 new pulmonary nodules, 3 within the RUL and 1 within the LLL (too small to characterize).   -CT Chest 12/10/21: multiple stable subcm bil pulm nodules. 2 nodules in the right have resolved while there is a new 1 on the left that measures 4 cm.  -Low suspicion for malignancy although not entirely r/o.  -Lesions are too small for CT guided bx  -Pt agreeable to Pulmonary consult.  #Leg pain L>R; MRI 05/05/21 medial tibial stress syndrome-unclear etiology. Given dilated veins referred 05/23/21 to Vascular Dr Skeet Simmer for u/s eval.  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   MRI suspicious for a bony lesion where she has pain and biopsy is recommended.     03/09/22 Ortho note epic:  etiology of the pt's pain remains unclear.   Imaging shows some abnormality of right pelvic bone, but a definitive diagnosis cannot be determined.   will await results from Dr. Merilyn Baba biopsy/injection and a report of the skeletal survey for further evaluation.   Rtc 3 weeks.    Lumbar radiculopathy (CE)  C5-6; severe degenerative disc disease (CE)   Pregnancy History:   Pediatrics:    negative pediatric ROS  no premature birth,       Pre Anesthesia Testing (PCC/CPC) notes/comments:    [facility] Test & [facility] reviewed by [facility]  Provider.                      03/09/22 Coags epic: unremarkable    CMP/CBC 02/12/22 (CE):  Na 141, K 4.4, Cr 0.80, BUN 20, Gluc 68  H/H 14.0/41.5; plt 103 (L)    Data reviewed- A. Derrill Kay RN 03/11/2022 9:13 AM    Pending:  (x) psq  ( ) per CT Chest 02/20/22 epic; pt w/Dilated ascending aorta measuring 3.9 cm diameter  (x) is there a ECHO or stress test available for review?  ( ) does pt f/u w/Cardiology? Obtain most recent clinical notes and cardiac testing (echo,stress,etc..)  ( ) platelets 103 (low) per lab 02/12/22-Care Everywhere; MD ok?  -lab re-draw prior to sx?  ( ) most recent Hematology/Oncology clinical notes  -pt with history of cancer; what kind of cancer;  diagnosed when and received chemo/radation therapy?  (per clinic no f/u by ent/pulm) most recent ENT/Pulmonary clinical notes; PFT's?  -finding of: 6 mm pleural-based right lower lobe nodule-per 02/20/22 CT Chest epic    Per pp msg 03/16/22: " spoke with pt today, she does not see an ENT/Pulmo (this is not available).  I've requested, received and uploaded most recent notes from hematology/oncology today (now available in media). I've faxed a request to her cardiologist for most recent notes and reports/results of test (like echo and stress test). "     ( ) per 12/24/21 Hem/Onc note (media):  "CT Chest 12/10/21: multiple stable subcm bil pulm nodules. 2 nodules in the right have resolved while there is a new 1 on the left that measures 4 cm.  Low suspicion for malignancy although not entirely r/o-Pt agreeable to Pulmonary consult"  -obtain most recent Pulmonary clinical notes and workup    - Also  ( ) pt c/o Leg pain L>R; MRI 05/05/21 medial tibial stress syndrome-unclear etiology. Given dilated veins referred 05/23/21 to Vascular Dr Skeet Simmer for u/s eval.  Pt sched for MRI f/b ortho visit; please obtain Vascular clinical notes and workup    Per clinic pp msg 03/18/22: "I s/w patient and she stated she did not know about a pulmonology consult so has not seen a pulmonologist.  She also stated she has not seen Vascular and was unaware of that. I was able to obtain Cardiology last visit note from 2021 with the Echo from 2021 they are both uploaded to Media. Pt states she does not follow cardiologist so her only visit was from 2021."    Case staffed w/Attending Anesthesiologist-Dr Kenton Kingfisher; 03/18/22- A. Derrill Kay RN    03/19/22-I am reviewing this information this morning at -[phone]. Proceed with the information made available and request the assigned anesthesia team review, interview and perform PE to determine if the patient is physically stable to proceed. [place]                Physical Exam    Airway:  Inter-inciser distance > 4 cm  Prognanth Unable    Mallampati: III  Neck ROM: full  TM distance: 5-6 cm  Short thick neck: No        Cardiovascular:  - cardiovascular exam normal         Pulmonary:  - pulmonary exam normal           Neuro/Neck/Skeletal/Skin:    Comment: Awake oriented        Dental:  - normal exam    Abdominal:                                                                                            Has a physician diagnosed you with sleep apnea?: Yes  Do you use a CPAP at home?: No  OSA total score (A score of 2 or more is high risk. Offer patient sleep study.): 1    Past Medical History:   Diagnosis Date   . Cataract    . Chronic osteomyelitis, pelvic region and thigh (CMS-HCC)    . Dry eye    .  Hypothyroidism    . RA (rheumatoid arthritis) (CMS-HCC)      Past Surgical History:   Procedure Laterality Date   . CATARACT EXTRACTION Right 09/02/2020   . BONE RESECTION Right 2019    [place] History     Socioeconomic History   . Marital status: Married   Tobacco Use   . Smoking status: Never   . Smokeless tobacco: Never   Substance and Sexual Activity   . Alcohol use: Never   . Drug use: Never       Current Facility-Administered Medications   Medication Dose Route Frequency Provider Last Rate Last Admin   . sodium chloride 0.9% infusion   IntraVENOUS Continuous Kathreen Devoid, MD         Allergies   Allergen Reactions   . Cefaclor Hives     No problems with amoxicillin/clavulanate - last taken October 2019; Other reaction(s): Pruritic rash (disorder); No problems with amoxicillin/clavulanate - last taken October 2019   . Ciprofloxacin Rash   . Diagnostic X-Ray Materials Rash   . Fd&C Blue #2 [place]-Guai Rash   . Gabapentin Nausea and Vomiting   . Hydromorphone Rash     pruritic rash to left arm - no shortness of breath;    Marland Kitchen Ibuprofen Hives and Rash     Aleve;    Marland Kitchen Meloxicam Rash   . Naproxen Rash   . Neomycin-Polymyxin B Gu Rash   . Polyethylene Glycol Hives     Other reaction(s): Pruritic rash (disorder);    Marland Kitchen Pseudoephedrine Rash   . Sulfamethoxazole W-Trimethoprim Hives and Rash     Other reaction(s): Pruritic rash (disorder)   . Guaifenesin Unspecified   . Homeopathic Products Other   . Sulfamethoxazole Other   . Trimethoprim Other   . Iodine Other       Labs and Other Data  Lab Results   Component Value Date    SODIUM 138 03/19/2022    K 3.6 03/19/2022    CL 103 03/19/2022    CO2 25 03/19/2022    BUN 16 03/19/2022    CREAT 0.8 03/19/2022    GLU 108 03/19/2022    Minor 8.8 03/19/2022     Lab Results   Component Value Date    AST 22 03/19/2022    ALT 29 03/19/2022    ALK 41 03/19/2022    ALB 3.9 03/19/2022    TBILI 0.5 03/19/2022     Lab Results   Component Value Date    WBCCOUNT 16.2 (H) 03/19/2022    RBC 4.33 03/19/2022    HGB 13.6 03/19/2022    HCT 39.7 03/19/2022    MCV 91.8 03/19/2022    MCHC 34.3 03/19/2022    RDW 14.7 (H) 03/19/2022    PLT 87 (L) 03/19/2022    MPVH 9.8 03/19/2022     Lab Results   Component Value Date    INR 1.00 03/18/2022    PTT 25.0 03/18/2022     No results found for: PH, PO2, PCO2    Anesthesia Plan:  Risks and Benefits of Anesthesia  I personally examined the patient immediately prior to the anesthetic and reviewed the pertinent medical history, drug and allergy history, laboratory and imaging studies and consultations. I have determined that the  patient has had adequate assessment and testing.      I have prescribed the anesthetic plan:         Planned anesthesia method: General  ASA 3 (Severe systemic disease)     Potential anesthesia problems identified and risks including but not limited to the following were discussed with patient and/or patient's representative: Adverse or allergic drug reaction, Recall, Dental injury or sore throat, Ocular injury and Nerve injury    No Beta Blocker Indicated: Planned monitoring method: Routine monitoring  Comments: (Spoke to the patient at bedside. Adequate NPO, injury to teeth, upper lip, soreness of throat, allergic reactions to GA medications explained to the patient. Endorsed full understanding. Decided to proceed.   History and physical exam confirmed. Risk Vs benefit discussion held. Discussed anesthetic plan in detail. All questions answered.  Consent obtained.  Vena Rua. M.D)    Informed Consent:  Anesthetic plan and risks discussed with Patient.    Plan discussed with CRNA and Attending.

## 2022-03-12 ENCOUNTER — Encounter: Payer: Self-pay | Admitting: Interventional Radiology

## 2022-03-12 ENCOUNTER — Encounter: Payer: Self-pay | Admitting: Orthopaedic Surgery

## 2022-03-12 DIAGNOSIS — M86659 Other chronic osteomyelitis, unspecified thigh: Secondary | ICD-10-CM

## 2022-03-13 ENCOUNTER — Encounter: Payer: Self-pay | Admitting: Orthopaedic Surgery

## 2022-03-13 ENCOUNTER — Telehealth: Payer: Self-pay | Admitting: Interventional Radiology

## 2022-03-13 MED ORDER — OXYCODONE HCL 5 MG OR CAPS
5.0000 mg | ORAL_CAPSULE | Freq: Four times a day (QID) | ORAL | 0 refills | Status: DC | PRN
Start: 2022-03-13 — End: 2022-03-19

## 2022-03-13 NOTE — Telephone Encounter (Addendum)
Patient notified of refill. No further action needed

## 2022-03-17 ENCOUNTER — Encounter: Payer: Self-pay | Admitting: Interventional Radiology

## 2022-03-17 ENCOUNTER — Other Ambulatory Visit: Payer: Self-pay | Admitting: Physician Assistant

## 2022-03-17 DIAGNOSIS — M86659 Other chronic osteomyelitis, unspecified thigh: Secondary | ICD-10-CM

## 2022-03-18 ENCOUNTER — Observation Stay
Admission: EM | Admit: 2022-03-18 | Discharge: 2022-03-19 | Disposition: A | Discharge status: Home / Self Care | Payer: Medicare Other | Attending: Hospitalist | Admitting: Hospitalist

## 2022-03-18 ENCOUNTER — Encounter: Payer: Self-pay | Admitting: Orthopaedic Surgery

## 2022-03-18 ENCOUNTER — Encounter: Payer: Self-pay | Admitting: Interventional Radiology

## 2022-03-18 DIAGNOSIS — R45851 Suicidal ideations: Secondary | ICD-10-CM | POA: Insufficient documentation

## 2022-03-18 DIAGNOSIS — G8929 Other chronic pain: Secondary | ICD-10-CM | POA: Insufficient documentation

## 2022-03-18 DIAGNOSIS — R21 Rash and other nonspecific skin eruption: Secondary | ICD-10-CM | POA: Insufficient documentation

## 2022-03-18 DIAGNOSIS — Z888 Allergy status to other drugs, medicaments and biological substances status: Secondary | ICD-10-CM | POA: Insufficient documentation

## 2022-03-18 DIAGNOSIS — R2681 Unsteadiness on feet: Secondary | ICD-10-CM

## 2022-03-18 DIAGNOSIS — R4581 Low self-esteem: Secondary | ICD-10-CM

## 2022-03-18 DIAGNOSIS — Z7951 Long term (current) use of inhaled steroids: Secondary | ICD-10-CM | POA: Insufficient documentation

## 2022-03-18 DIAGNOSIS — R52 Pain, unspecified: Secondary | ICD-10-CM

## 2022-03-18 DIAGNOSIS — R2689 Other abnormalities of gait and mobility: Secondary | ICD-10-CM | POA: Insufficient documentation

## 2022-03-18 DIAGNOSIS — Z20822 Contact with and (suspected) exposure to covid-19: Secondary | ICD-10-CM | POA: Insufficient documentation

## 2022-03-18 DIAGNOSIS — Z886 Allergy status to analgesic agent status: Secondary | ICD-10-CM | POA: Insufficient documentation

## 2022-03-18 DIAGNOSIS — Z789 Other specified health status: Secondary | ICD-10-CM

## 2022-03-18 DIAGNOSIS — M069 Rheumatoid arthritis, unspecified: Secondary | ICD-10-CM | POA: Insufficient documentation

## 2022-03-18 DIAGNOSIS — R7881 Bacteremia: Secondary | ICD-10-CM | POA: Insufficient documentation

## 2022-03-18 DIAGNOSIS — Z881 Allergy status to other antibiotic agents status: Secondary | ICD-10-CM | POA: Insufficient documentation

## 2022-03-18 DIAGNOSIS — Z885 Allergy status to narcotic agent status: Secondary | ICD-10-CM | POA: Insufficient documentation

## 2022-03-18 DIAGNOSIS — M25551 Pain in right hip: Principal | ICD-10-CM | POA: Insufficient documentation

## 2022-03-18 DIAGNOSIS — Z882 Allergy status to sulfonamides status: Secondary | ICD-10-CM | POA: Insufficient documentation

## 2022-03-18 DIAGNOSIS — R102 Pelvic and perineal pain: Secondary | ICD-10-CM | POA: Insufficient documentation

## 2022-03-18 DIAGNOSIS — E039 Hypothyroidism, unspecified: Secondary | ICD-10-CM | POA: Insufficient documentation

## 2022-03-18 DIAGNOSIS — Z7409 Other reduced mobility: Secondary | ICD-10-CM

## 2022-03-18 DIAGNOSIS — M869 Osteomyelitis, unspecified: Secondary | ICD-10-CM | POA: Diagnosis present

## 2022-03-18 LAB — COMPREHENSIVE METABOLIC PANEL, BLOOD
ALT: 30 U/L (ref 7–52)
AST: 24 U/L (ref 13–39)
Albumin: 4 G/DL (ref 3.7–5.3)
Alk Phos: 40 U/L (ref 34–104)
BUN: 16 mg/dL (ref 7–25)
Bilirubin, Total: 0.6 mg/dL (ref 0.0–1.4)
CO2: 26 mmol/L (ref 21–31)
Calcium: 9.2 mg/dL (ref 8.6–10.3)
Chloride: 105 mmol/L (ref 98–107)
Creat: 0.7 mg/dL (ref 0.6–1.2)
Electrolyte Balance: 8 mmol/L (ref 2–12)
Glucose: 98 mg/dL (ref 85–125)
Potassium: 3.6 mmol/L (ref 3.5–5.1)
Protein, Total: 6.4 G/DL (ref 6.0–8.3)
Sodium: 139 mmol/L (ref 136–145)
eGFR - high estimate: >60 (ref >59)
eGFR - low estimate: >60 (ref >59)

## 2022-03-18 LAB — DRUG SCREEN RAPID PANEL 12 NO CONFIRMATION, URINE
Amphetamines, Urine: NOT DETECTED
Barbiturates: NOT DETECTED
Benzodiazepines: NOT DETECTED
Cocaine: NOT DETECTED
Fentanyl,  U Scrn: NOT DETECTED
MDMA: NOT DETECTED
Methadone: NOT DETECTED
Opiates: POSITIVE — AB
Oxycodone Lvl, UR: POSITIVE — AB
PCP: NOT DETECTED
Propoxyphene: NOT DETECTED
THC: POSITIVE — AB

## 2022-03-18 LAB — URINALYSIS
Bilirubin, UA: NEGATIVE
Glucose, UA: NEGATIVE MG/DL
Ketones, UA: NEGATIVE MG/DL
Nitrite, UA: NEGATIVE
Protein, UA: 10 MG/DL — AB
RBC, UA: 16 #/HPF — ABNORMAL HIGH (ref 0–3)
Specific Grav, UA: 1.018 (ref 1.003–1.030)
Squamous Epithelial, UA: 1 /HPF (ref 0–10)
Urobilinogen, UA: <2 MG/DL (ref <2)
WBC, UA: >182 #/HPF — ABNORMAL HIGH (ref 0–5)
pH, UA: 6.5 (ref 5.0–8.0)

## 2022-03-18 LAB — CBC WITH DIFF, BLOOD
ANC automated: 10.7 10*3/uL — ABNORMAL HIGH (ref 2.0–8.1)
Basophils %: 0.3 %
Basophils Absolute: 0 10*3/uL (ref 0.0–0.2)
Eosinophils %: 1.3 %
Eosinophils Absolute: 0.2 10*3/uL (ref 0.0–0.5)
Hematocrit: 40.4 % (ref 34.0–44.0)
Hgb: 13.3 G/DL (ref 11.5–15.0)
Lymphocytes %: 14.9 %
Lymphocytes Absolute: 2 10*3/uL (ref 0.9–3.3)
MCH: 30.1 PG (ref 27.0–33.5)
MCHC: 32.9 G/DL (ref 32.0–35.5)
MCV: 91.4 FL (ref 81.5–97.0)
MPV: 9.3 FL (ref 7.2–11.7)
Monocytes %: 2.8 %
Monocytes Absolute: 0.4 10*3/uL (ref 0.0–0.8)
Neutrophils % (A): 80.7 %
PLT Count: 80 10*3/uL — ABNORMAL LOW (ref 150–400)
Platelet Morphology: NORMAL
RBC Morphology: NORMAL
RBC: 4.42 10*6/uL (ref 3.70–5.00)
RDW-CV: 14.8 % — ABNORMAL HIGH (ref 11.6–14.4)
White Bld Cell Count: 13.3 10*3/uL — ABNORMAL HIGH (ref 4.0–10.5)

## 2022-03-18 LAB — PT/INR/PTT
INR: 1 (ref 0.89–1.11)
PTT: 25 s (ref 24.2–36.7)
Prothrombin Time: 13.1 s (ref 12.0–14.2)

## 2022-03-18 LAB — CORONAVIRUS DISEASE 2019 (COVID-19) SCOVS
COVID-19 Comment: NOT DETECTED
COVID-19 Result: NOT DETECTED

## 2022-03-18 MED ORDER — MORPHINE SULFATE 4 MG/ML IJ SOLN
4.0000 mg | INTRAMUSCULAR | Status: AC | PRN
Start: 2022-03-18 — End: 2022-03-19
  Administered 2022-03-18 – 2022-03-19 (×2): 4 mg via INTRAVENOUS
  Filled 2022-03-18 (×2): qty 1

## 2022-03-18 MED ORDER — ACETAMINOPHEN 325 MG PO TABS
975.0000 mg | ORAL_TABLET | Freq: Three times a day (TID) | ORAL | Status: DC
Start: 2022-03-18 — End: 2022-03-20
  Administered 2022-03-18 – 2022-03-19 (×3): 975 mg via ORAL
  Filled 2022-03-18 (×3): qty 3

## 2022-03-18 MED ORDER — OXYCODONE HCL 5 MG OR TABS
10.0000 mg | ORAL_TABLET | ORAL | Status: DC | PRN
Start: 2022-03-18 — End: 2022-03-20

## 2022-03-18 MED ORDER — ONDANSETRON HCL 4 MG OR TABS
4.0000 mg | ORAL_TABLET | Freq: Four times a day (QID) | ORAL | Status: DC | PRN
Start: 2022-03-18 — End: 2022-03-20

## 2022-03-18 MED ORDER — MORPHINE SULFATE 4 MG/ML IJ SOLN
4.0000 mg | INTRAMUSCULAR | Status: DC | PRN
Start: 2022-03-18 — End: 2022-03-18
  Administered 2022-03-18 (×2): 4 mg via INTRAVENOUS
  Filled 2022-03-18 (×2): qty 1

## 2022-03-18 MED ORDER — ACETAMINOPHEN 650 MG RE SUPP
650.0000 mg | RECTAL | Status: DC | PRN
Start: 2022-03-18 — End: 2022-03-20

## 2022-03-18 MED ORDER — ONDANSETRON 4 MG OR TBDP
4.0000 mg | ORAL_TABLET | Freq: Once | ORAL | Status: AC
Start: 2022-03-18 — End: 2022-03-18
  Administered 2022-03-18: 4 mg via ORAL
  Filled 2022-03-18: qty 1

## 2022-03-18 MED ORDER — ALUM & MAG HYDROXIDE-SIMETH 400-400-40 MG/5ML OR SUSP
15.0000 mL | Freq: Four times a day (QID) | ORAL | Status: DC | PRN
Start: 2022-03-18 — End: 2022-03-20
  Administered 2022-03-19: 15 mL via ORAL
  Filled 2022-03-18: qty 30

## 2022-03-18 MED ORDER — ACETAMINOPHEN 325 MG PO TABS
650.0000 mg | ORAL_TABLET | ORAL | Status: DC | PRN
Start: 2022-03-18 — End: 2022-03-20

## 2022-03-18 MED ORDER — ONDANSETRON HCL 4 MG/2ML IV SOLN
4.0000 mg | Freq: Four times a day (QID) | INTRAMUSCULAR | Status: DC | PRN
Start: 2022-03-18 — End: 2022-03-20
  Administered 2022-03-18 – 2022-03-19 (×2): 4 mg via INTRAVENOUS
  Filled 2022-03-18 (×2): qty 2

## 2022-03-18 MED ORDER — SENNA 8.6 MG OR TABS
8.6000 mg | ORAL_TABLET | Freq: Every evening | ORAL | Status: DC
Start: 2022-03-18 — End: 2022-03-20
  Administered 2022-03-18 – 2022-03-19 (×2): 8.6 mg via ORAL
  Filled 2022-03-18 (×2): qty 1

## 2022-03-18 MED ORDER — OXYCODONE HCL 5 MG OR TABS
15.0000 mg | ORAL_TABLET | ORAL | Status: DC | PRN
Start: 2022-03-18 — End: 2022-03-20
  Administered 2022-03-19 (×2): 15 mg via ORAL
  Filled 2022-03-18 (×2): qty 3

## 2022-03-18 MED ORDER — OXYCODONE HCL 5 MG OR TABS
5.0000 mg | ORAL_TABLET | ORAL | Status: DC | PRN
Start: 2022-03-18 — End: 2022-03-18

## 2022-03-18 MED ORDER — OXYCODONE HCL 5 MG OR TABS
10.0000 mg | ORAL_TABLET | ORAL | Status: DC | PRN
Start: 2022-03-18 — End: 2022-03-18
  Administered 2022-03-18: 10 mg via ORAL
  Filled 2022-03-18: qty 2

## 2022-03-18 MED ORDER — MORPHINE SULFATE 2 MG/ML IJ SOLN
2.0000 mg | INTRAMUSCULAR | Status: DC | PRN
Start: 2022-03-18 — End: 2022-03-18
  Administered 2022-03-18: 2 mg via INTRAVENOUS
  Filled 2022-03-18: qty 1

## 2022-03-18 NOTE — Consults (Signed)
New consult - Orthopaedic Surgery  Date of visit: 03/18/2022    Inpatient Consultation Requested by Attending Physician: Myrtice Lauth, MD    Assessment:  76 year old female with the following orthopedic injuries: chronic right sided pelvic pain      Plan:     -no planned intervention with orthopaedic surgery at this time  -rec admission to medicine for work up of pulmonary nodules, clearance for IR procedure, and pain control  -follow-up outpatient in orthopaedic clinic as scheduled    I, Dr. Jeneen Rinks Rafik Koppel, discussed but did not see this patient. Patient well known to me with severe pain, previous surgery at [facility]. Awaiting biopsy to better understand etiology and possible interventions as her imaging was not helpful in diagnosis.      Chief complaint : right pelvic pain    History of Present Illness: Shelly Neal is a 76 year old female who presents to [facility] for evaluation of 4 months of increasing right sided pelvic pain.  At the time of interview, patient states that she  Was scheduled to have a biopsy and CSI with IR on Friday however anesthesia is requiring further workup of pulmonary nodules before she may undergo with the proceedure. In the meantime the pain has become worse, limiting her ability to get around and take care of herself. Patient denies any numbness or tingling in her arms or legs.     Review of Systems:  12 point review of systems negative except as mentioned in history of present illness and past medical history.    Past Medial History:  Past Medical History:   Diagnosis Date   . Cataract    . Chronic osteomyelitis, pelvic region and thigh (CMS-HCC)    . Dry eye    . Hypothyroidism    . RA (rheumatoid arthritis) (CMS-HCC)       As above, otherwise no pertinent history per patient    Past Surgical History:  Past Surgical History:   Procedure Laterality Date   . CATARACT EXTRACTION Right 09/02/2020   . BONE RESECTION Right 2019    [facility]      As above, otherwise no pertinent history  per patient    Medications:  No outpatient medications have been marked as taking for the 03/18/22 encounter [facility] Encounter).       Allergies:  Cefaclor, Ciprofloxacin, Diagnostic x-ray materials, Fd&c blue #2 [place]-guai, Gabapentin, Hydromorphone, Ibuprofen, Meloxicam, Naproxen, Neomycin-polymyxin b gu, Polyethylene glycol, Pseudoephedrine, Sulfamethoxazole w-trimethoprim, Guaifenesin, Homeopathic products, Sulfamethoxazole, Trimethoprim, and Iodine      Social History:  Social History     Tobacco Use   Smoking Status Never   Smokeless Tobacco Never     Social History     Substance and Sexual Activity   Alcohol Use Never     Social History     Substance and Sexual Activity   Drug Use Never        As above, otherwise no pertinent history per patient    Family History:   As above, otherwise no pertinent history per patient    Physical exam:  BP 126/64 (BP Location: Left arm, BP Patient Position: Semi-Fowlers)   Pulse 58   Temp 97.8 F (36.6 C)   Resp 20   Wt 74.8 kg (165 lb)   SpO2 100%   BMI 24.37 kg/m   General: No acute distress  Neck: No obvious deformity, trachea midline  Respiratory: Speaks in complete sentences, nonlabored respiration  Abdomen: not distended  Neuro: follows commands and answers questions appropriately  Pelvis: stable to compression  Psych : normal affect  Musculoskeletal:      Right Lower Extremity   - skin intact, well healed surgical scar present over posterolateral hip   - pain in hip with log roll              - fires quads, hamstrings, tibialis anterior, gastrocnemius              - sensation intact to light touch L2-S1              - 2+ DP/PT pulses      Laboratory studies:  No results for input(s): WBC, HGB, PLT, SODIUM, K, CL, CO2, GLU, BUN, CREAT, [place], ESR, CRPC, A1C in the last 24 hours.  Radiographic studies:  MRI Pelvis Bone W/WO Contrast    Result Date: 02/27/2022  Enhancing lesions of the right iliac bone and left sacral ala, which may be related to a chronic  infectious process or a neoplastic disease such as lymphoma. Recommend correlation with prior imaging if available and biopsy if clinically indicated. PET/CT may also provide additional information. I have personally reviewed the images upon which this report is based and agree with the findings and conclusions expressed above.    CT Chest W/O Contrast    Result Date: 02/22/2022  Lucent changes in the T5, T9, T10, and T11 vertebral bodies, nonspecific and may be hemangiomas. Sclerotic changes in the T5 vertebral body, more than expected for degenerative change. Recommend MR thoracic spine without and with intravenous contrast for better evaluation. 6 mm pleural-based right lower lobe nodule. Few bilateral pleural-based and nonpleural-based micronodules. Linear scarring/atelectasis in bilateral lower lobes. Peribronchial wall thickening. Scattered mucous plugging. 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 Small pericardial effusion. Chronic findings, as above.     CT Abdomen And Pelvis W/O Contrast    Result Date: 02/21/2022  1.  Multiple sclerotic bone lesions, concerning for metastatic malignancy. Evaluation for neoplastic process is limited by lack of intravenous contrast. No abnormality suspicious for malignancy identified in the abdomen and pelvis at noncontrast CT. END

## 2022-03-18 NOTE — ED Notes (Signed)
Assumed care of pt, received report from [facility]. Pt alert, oriented x 4. Calm and cooperative with staff, denies SI or plan. Pain improved after morphine, now 2/10 but c/o nausea. Administered zofran ODT. Pt able to move RLE without difficulty, pain not exacerbated with movement or ambulation. CMS intact. Kept comfortable and attended needs. Instructed to alert staff when in need of assistance and reminded pt of need for UA. Pt verbalized understanding.

## 2022-03-18 NOTE — ED Notes (Signed)
Covering primary nurse Joie RN for break.

## 2022-03-18 NOTE — ED Notes (Signed)
MD's okay with pt eating. Pt given sandwich.    Pt is alert and oriented x4, cooperative, VSS. Pt stated that she's been having worsening pain in R hip that radiates to back. Pt medicated and stated that her pain is more tolerable. Husband, Marcelina Morel, at bedside. Bed in lowest position with wheels locked; side rails 2/2.

## 2022-03-18 NOTE — ED Notes (Signed)
Bed: 21  Expected date:   Expected time:   Means of arrival:   Comments:  Psych pt

## 2022-03-18 NOTE — Consults (Signed)
Department of Psychiatry and Human Behavior  Initial Consultation Note      Request for Consultation: Asked by Myrtice Lauth, MD to evaluate this patient for suicidal ideation with specific plan.    History of Present Illness:   Shelly Neal is a 76 year old female with no previous psychiatric diagnosis who presents to ED brought in by husband voluntarily for right hip pain, incidentally expressed suicidal ideation secondary to hip pain. No UDS done.    Patient has had "excruciating" hip pain since 2020. She lived with the pain for a year before going for surgery, which relieved the pain until four months ago, when it once again became "unbearable." She reports her first suicidal ideation around this time. Patient states that if she were to leave with the pain still as it is, she will go into her car in her garage, turn it on, and "wait to die." Patient states if pain is adequately addressed her SI would completely resolve. Reports no prior SI except in context of this pain.    Patient does report mother with history of suicide. Husband at bedside reports owning unsecured shotgun in home.    Patient reports very supportive family unit including several children/step children, and grandchildren.    Psychiatric ROS all negative unless otherwise discussed as above in the HPI.     Suicide Assessment Five-step Evaluation and Triage (SAFE-T):    1. RISK FACTORS  Current/Past Psychiatric Diagnosis  Key Symptoms: Hopelessness or despair  Family History: Death by suicide  Precipitants/Stressors: Chronic physical pain or other acute medical problem  Change in Treatment: No known recent changes in provider or treatment  Access to Firearms: Yes  Firearms secured in safe:  No    2. PROTECTIVE FACTORS   Internal Protective Factors: Ability to cope with stress, Identifies reasons for living, Frustration tolerance, Religious beliefs  External Protective Factors: Cultural, spiritual and/or moral attitudes against suicide,  Supportive social network of family or friends, Responsibility to children, Positive therapeutic relationships    3a. SUICIDE INQUIRY   Does the Patient Have Any Suicidal Ideation, Plans OR Intent:  Yes    3b. HOMICIDE INQUIRY   Homicidal:  No    4. RISK LEVEL/INTERVENTION   Risk/Protective Factors:  Modifiable risk factors, strong protective factors              LOW ACUTE RISK    5. DOCUMENTATION/INTERVENTION   See Recommendations section at bottom of this note        Non-psychiatric Review of Systems: Patient denies CP, abd pain, n/v. Review of Systems - All others negative     Psychiatric History:   Diagnoses and Course of Illness(es): none  Hospitalizations: none  Medication Trials: none  Current mental health providers: none    Substance Use:   Tobacco: No tobacco use.  Alcohol: No alcohol use.  Cannabis: No cannabis use.  Other Drugs: No other non-prescribed medication use.  History of substance abuse treatment: No substance use.    Medications Prior to Admission:  No current facility-administered medications on file prior to encounter.     Current Outpatient Medications on File Prior to Encounter   Medication Sig   . acetaminophen (TYLENOL) 500 MG tablet Take 1 tablet (500 mg) by mouth.   . Acetaminophen 325 MG CAPS Take 650 mg by mouth.   Marland Kitchen albuterol 108 (90 Base) MCG/ACT inhaler Inhale by mouth.   Marland Kitchen anterior blue eye kit USED AS INSTRUCTED BY YOUR PHYSICIAN (KIT FOR  RIGHT EYE)   . Azelastine HCl 0.15 % SOLN 1 drop in each nostril   . Azelastine HCl 137 MCG/SPRAY SOLN SPRAY 1 SPRAY INTO EACH NOSTRIL TWICE A DAY   . budesonide-formoterol (SYMBICORT) 160-4.5 MCG/ACT inhaler Inhale 2 puffs by mouth.   . budesonide-formoterol (SYMBICORT) 160-4.5 MCG/ACT inhaler Inhale 2 puffs by mouth.   . Carboxymethylcellulose Sodium (THERATEARS) 0.25 % SOLN Place 1 drop into both eyes daily.   . cetirizine (ZYRTEC) 10 MG tablet Take 1 tablet (10 mg) by mouth daily.   . cetirizine (ZYRTEC) 5 MG tablet Take 2 tablets (10 mg) by  mouth.   . cycloSPORINE (RESTASIS) 0.05 % ophthalmic emulsion 1 drop by Ophthalmic route.   . cycloSPORINE (RESTASIS) 0.05 % ophthalmic emulsion Place 1 drop into both eyes 2 times daily.   . ergocalciferol (VITAMIN D) 50000 UNIT capsule TAKE 1 CAPSULE BY MOUTH WEEKLY FOR 8 WEEKS   . fentaNYL (DURAGESIC) 25 MCG/HR patch PLACE 1 PATCH ONTO THE SKIN EVERY 72 HOURS.   . fluorometholone (FML) 0.1 % ophthalmic suspension Place 1 drop into both eyes 4 times daily.   . fluticasone propionate (FLONASE) 50 MCG/ACT nasal spray 1 spray by Nasal route.   . fluticasone-salmeterol (WIXELA INHUB) 250-50 MCG/DOSE inhaler    . folic acid (FOLVITE) 1 MG tablet 1 tablet (1 mg) by Oral route.   Marland Kitchen HYDROcodone-acetaminophen (NORCO) 10-325 MG tablet    . levothyroxine (SYNTHROID) 88 MCG tablet Take 1 tablet (88 mcg) by mouth.   . lidocaine-prilocaine (EMLA) 2.5-2.5 % cream APPLY 1 APPLICATION ON THE SKIN AS DIRECTED APPLY 10 GRAMS TO SKIN 30 MINUTES PRIOR TO PROCEDURE.   . methotrexate (RHEUMATREX) 250 MG/10ML SOLN injection INJECT 0.8 ML EVERY 7 DAYS--MULTIDOSE VIALS   . methotrexate (TREXALL) 2.5 MG tablet As Directed   . Methotrexate, PF, (RASUVO) 7.5 MG/0.15ML SOAJ Inject 0.15 mL under the skin every 7 days.   . methylPREDNISolone sodium succinate (SOLU-MEDROL) 2000 MG injection Inject 125 mg into vein.   . naloxegol oxalate (MOVANTIK) 12.5 MG TABS tablet Take 12.5-25 mg by mouth.   . naloxone (NARCAN) 4 mg/0.1 mL nasal spray 1 spray.   . naproxen (ALEVE) 220 MG tablet Take by mouth.   . nitrofurantoin monohydrate (MACROBID) 100 MG capsule TAKE 1 CAPSULE BY MOUTH EVERY 12 HOURS WITH FOOD   . Olopatadine HCl 0.7 % SOLN Place 1 drop into both eyes daily.   Marland Kitchen oxyCODONE (ROXICODONE) 5 MG capsule Take 1 capsule (5 mg) by mouth every 6 hours as needed for Severe Pain (Pain Score 7-10) or Breakthrough Pain.   [place] Calcium 500 MG TABS 1 tablet   . phenazopyridine (PYRIDIUM) 100 MG tablet TAKE 1 TABLET BY MOUTH THREE TIMES A DAY  AFTER MEALS AS NEEDED   . prednisoLONE acetate (PRED FORTE) 1 % ophthalmic suspension PLACE 1 DROP INTO RIGHT EYE 4 TIMES DAILY. START 3 DAYS PRIOR TO SURGERY   . predniSONE (DELTASONE) 5 MG tablet Take 2 tablets (10 mg) by mouth daily.   Marland Kitchen PREDNISONE PO    . sertraline (ZOLOFT) 100 MG tablet Take 1 tablet (100 mg) by mouth daily.   . sertraline (ZOLOFT) 100 MG tablet Take 1.5 tablets (150 mg) by mouth daily.   Marland Kitchen SYNTHROID 88 MCG tablet Take 1 tablet (88 mcg) by mouth daily.   Marland Kitchen tiotropium (SPIRIVA HANDIHALER) 18 MCG inhalation capsule Inhale by mouth.   . tobramycin (TOBREX) 0.3 % ophthalmic solution Place 1 drop into right eye every 4  hours. Start 3 days prior to surgery   . tobramycin (TOBREX) 0.3 % ophthalmic solution Place 1 drop into right eye 4 times daily.   Marland Kitchen tobramycin (TOBREX) 0.3 % ophthalmic solution Place 1 drop into right eye 4 times daily. Start 3 days prior to surgery   . tocilizumab (ACTEMRA) 162 MG/0.9ML SOSY injection Inject 0.9 mL (162 mg) under the skin every 7 days.   Marland Kitchen tocilizumab (ACTEMRA) 200 MG/10ML SOLN Inject 619 mg into vein.   Marland Kitchen traMADol (ULTRAM) 50 MG tablet TAKE 1 TABLET BY MOUTH TWICE A DAY AS NEEDED FOR PAIN   . XIIDRA 5 % SOLN        Current Hospital Medications:    Current Facility-Administered Medications:   .  morphine injection 4 mg, 4 mg, IntraVENOUS, Q10 Min PRN, Leana Gamer, MD, 4 mg at 03/18/22 1606    Current Outpatient Medications:   .  acetaminophen (TYLENOL) 500 MG tablet, Take 1 tablet (500 mg) by mouth., Disp: , Rfl:   .  Acetaminophen 325 MG CAPS, Take 650 mg by mouth., Disp: , Rfl:   .  albuterol 108 (90 Base) MCG/ACT inhaler, Inhale by mouth., Disp: , Rfl:   .  anterior blue eye kit, USED AS INSTRUCTED BY YOUR PHYSICIAN (KIT FOR RIGHT EYE), Disp: 1 kit, Rfl: 0  .  Azelastine HCl 0.15 % SOLN, 1 drop in each nostril, Disp: , Rfl:   .  Azelastine HCl 137 MCG/SPRAY SOLN, SPRAY 1 SPRAY INTO EACH NOSTRIL TWICE A DAY, Disp: , Rfl:   .  budesonide-formoterol (SYMBICORT)  160-4.5 MCG/ACT inhaler, Inhale 2 puffs by mouth., Disp: , Rfl:   .  budesonide-formoterol (SYMBICORT) 160-4.5 MCG/ACT inhaler, Inhale 2 puffs by mouth., Disp: , Rfl:   .  Carboxymethylcellulose Sodium (THERATEARS) 0.25 % SOLN, Place 1 drop into both eyes daily., Disp: , Rfl:   .  cetirizine (ZYRTEC) 10 MG tablet, Take 1 tablet (10 mg) by mouth daily., Disp: , Rfl:   .  cetirizine (ZYRTEC) 5 MG tablet, Take 2 tablets (10 mg) by mouth., Disp: , Rfl:   .  cycloSPORINE (RESTASIS) 0.05 % ophthalmic emulsion, 1 drop by Ophthalmic route., Disp: , Rfl:   .  cycloSPORINE (RESTASIS) 0.05 % ophthalmic emulsion, Place 1 drop into both eyes 2 times daily., Disp: 2 each, Rfl: 0  .  ergocalciferol (VITAMIN D) 50000 UNIT capsule, TAKE 1 CAPSULE BY MOUTH WEEKLY FOR 8 WEEKS, Disp: , Rfl:   .  fentaNYL (DURAGESIC) 25 MCG/HR patch, PLACE 1 PATCH ONTO THE SKIN EVERY 72 HOURS., Disp: , Rfl:   .  fluorometholone (FML) 0.1 % ophthalmic suspension, Place 1 drop into both eyes 4 times daily., Disp: 1 bottle, Rfl: 0  .  fluticasone propionate (FLONASE) 50 MCG/ACT nasal spray, 1 spray by Nasal route., Disp: , Rfl:   .  fluticasone-salmeterol (WIXELA INHUB) 250-50 MCG/DOSE inhaler, , Disp: , Rfl:   .  folic acid (FOLVITE) 1 MG tablet, 1 tablet (1 mg) by Oral route., Disp: , Rfl:   .  HYDROcodone-acetaminophen (NORCO) 10-325 MG tablet, , Disp: , Rfl:   .  levothyroxine (SYNTHROID) 88 MCG tablet, Take 1 tablet (88 mcg) by mouth., Disp: , Rfl:   .  lidocaine-prilocaine (EMLA) 2.5-2.5 % cream, APPLY 1 APPLICATION ON THE SKIN AS DIRECTED APPLY 10 GRAMS TO SKIN 30 MINUTES PRIOR TO PROCEDURE., Disp: , Rfl:   .  methotrexate (RHEUMATREX) 250 MG/10ML SOLN injection, INJECT 0.8 ML EVERY 7 DAYS--MULTIDOSE VIALS, Disp: , Rfl:   .  methotrexate (TREXALL) 2.5 MG tablet, As Directed, Disp: , Rfl:   .  Methotrexate, PF, (RASUVO) 7.5 MG/0.15ML SOAJ, Inject 0.15 mL under the skin every 7 days., Disp: , Rfl:   .  methylPREDNISolone sodium succinate (SOLU-MEDROL)  2000 MG injection, Inject 125 mg into vein., Disp: , Rfl:   .  naloxegol oxalate (MOVANTIK) 12.5 MG TABS tablet, Take 12.5-25 mg by mouth., Disp: , Rfl:   .  naloxone (NARCAN) 4 mg/0.1 mL nasal spray, 1 spray., Disp: , Rfl:   .  naproxen (ALEVE) 220 MG tablet, Take by mouth., Disp: , Rfl:   .  nitrofurantoin monohydrate (MACROBID) 100 MG capsule, TAKE 1 CAPSULE BY MOUTH EVERY 12 HOURS WITH FOOD, Disp: , Rfl:   .  Olopatadine HCl 0.7 % SOLN, Place 1 drop into both eyes daily., Disp: , Rfl:   .  oxyCODONE (ROXICODONE) 5 MG capsule, Take 1 capsule (5 mg) by mouth every 6 hours as needed for Severe Pain (Pain Score 7-10) or Breakthrough Pain., Disp: 35 capsule, Rfl: 0  .  [place] Calcium 500 MG TABS, 1 tablet, Disp: , Rfl:   .  phenazopyridine (PYRIDIUM) 100 MG tablet, TAKE 1 TABLET BY MOUTH THREE TIMES A DAY AFTER MEALS AS NEEDED, Disp: , Rfl:   .  prednisoLONE acetate (PRED FORTE) 1 % ophthalmic suspension, PLACE 1 DROP INTO RIGHT EYE 4 TIMES DAILY. START 3 DAYS PRIOR TO SURGERY, Disp: , Rfl:   .  predniSONE (DELTASONE) 5 MG tablet, Take 2 tablets (10 mg) by mouth daily., Disp: , Rfl:   .  PREDNISONE PO, , Disp: , Rfl:   .  sertraline (ZOLOFT) 100 MG tablet, Take 1 tablet (100 mg) by mouth daily., Disp: , Rfl:   .  sertraline (ZOLOFT) 100 MG tablet, Take 1.5 tablets (150 mg) by mouth daily., Disp: , Rfl:   .  SYNTHROID 88 MCG tablet, Take 1 tablet (88 mcg) by mouth daily., Disp: , Rfl:   .  tiotropium (SPIRIVA HANDIHALER) 18 MCG inhalation capsule, Inhale by mouth., Disp: , Rfl:   .  tobramycin (TOBREX) 0.3 % ophthalmic solution, Place 1 drop into right eye every 4 hours. Start 3 days prior to surgery, Disp: 1 each, Rfl: 0  .  tobramycin (TOBREX) 0.3 % ophthalmic solution, Place 1 drop into right eye 4 times daily., Disp: 5 mL, Rfl: 0  .  tobramycin (TOBREX) 0.3 % ophthalmic solution, Place 1 drop into right eye 4 times daily. Start 3 days prior to surgery, Disp: 1 bottle, Rfl: 0  .  tocilizumab (ACTEMRA) 162  MG/0.9ML SOSY injection, Inject 0.9 mL (162 mg) under the skin every 7 days., Disp: , Rfl:   .  tocilizumab (ACTEMRA) 200 MG/10ML SOLN, Inject 619 mg into vein., Disp: , Rfl:   .  traMADol (ULTRAM) 50 MG tablet, TAKE 1 TABLET BY MOUTH TWICE A DAY AS NEEDED FOR PAIN, Disp: , Rfl:   .  XIIDRA 5 % SOLN, , Disp: , Rfl:     Allergies:   Allergies as of 03/18/2022 - Verified 03/18/2022   Allergen Reaction Noted   . Cefaclor Hives 08/15/2019   . Ciprofloxacin Rash 05/27/2017   . Diagnostic x-ray materials Rash 01/10/2018   . Fd&c blue #2 [place] Rash 03/25/2020   . Gabapentin Nausea and Vomiting 06/17/2017   . Hydromorphone Rash 01/24/2017   . Ibuprofen Hives and Rash 04/27/2017   . Meloxicam Rash 05/04/2017   . Naproxen Rash 01/24/2017   . Neomycin-polymyxin  b gu Rash 09/21/2017   . Polyethylene glycol Hives 08/15/2019   . Pseudoephedrine Rash 04/12/2020   . Sulfamethoxazole w-trimethoprim Hives and Rash 08/15/2019   . Guaifenesin Unspecified 03/25/2020   . Homeopathic products Other 10/31/2002   . Sulfamethoxazole Other 02/16/2022   . Trimethoprim Other 02/16/2022   . Iodine Other 01/10/2021         Past Medical History and Past Surgical History:   Past Medical History:   Diagnosis Date   . Cataract    . Chronic osteomyelitis, pelvic region and thigh (CMS-HCC)    . Dry eye    . Hypothyroidism    . RA (rheumatoid arthritis) (CMS-HCC)       Past Surgical History:   Procedure Laterality Date   . CATARACT EXTRACTION Right 09/02/2020   . BONE RESECTION Right 2019    [facility]         Social History:   Living Situation: Lives with husband.  Financial Support/Occupation: Retired, has stable finances.  Primary Support Network/Family: Has husband who lives with her. two children not at home who call often.  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  Legal Issues: None.  Trauma: None.    Family History:   No family history on file.    Psychiatric Illness: None.  Suicide: Mother committed suicide due to "headaches" after repeated  treatment attempts.  Substance Abuse: None.    Most Recent Vital Signs:   Vitals:    03/18/22 1416 03/18/22 1421 03/18/22 1606   BP: 116/82  126/64   BP Location:   Left arm   BP Patient Position:   Semi-Fowlers   Pulse: 63  58   Resp: 18  20   Temp:   97.8 F (36.6 C)   SpO2: 99%  100%   Weight:  74.8 kg (165 lb)        Mental Status Exam:   Appearance:     Dress/Hygiene: clean and neat     Attitude/Cooperation: collaborative/engaged  Psychomotor:      Eye Contact: maintains gaze  Expressive Speech:      Rate: steady/regulated/normal        Volume/Tone/Prosody: conversational level       Fluency: even and without stuttering or other articulation/phonological defects     Posture: reclined        Activity: decreased/slowed     Extrapyramidal symptoms: none observed/displayed     Catatonia: none observed/displayed     Mannerisms/Gestures: without displayed tics, compulsions, or mannerisms  Mood:      Inquired emotion: hopeful     Mood Observed: euthymic  Affect:      Observed: neutral     Consistency/Amplitude: even  Thought:     Form/Order: linear     Suicidality: suicidal plan and suicidal thoughts     Homicidality: denies homicidal thoughts     Deliberate self harm: denies     Perceptions: denies altered perceptions     Reality testing: intact  Cognition:     Orientation: accurate to person, accurate to place, accurate to time and accurate to purpose     Attention: intact for session     Insight/awareness into Illness/Symptoms: intact psychic     Judgment: intact psychic      Laboratory Studies (last 24 hours):   Recent Results (from the past 24 hour(s))   CBC w/ Diff [place]    Collection Time: 03/18/22  4:37 PM   Result Value Ref Range    White Bld Cell Count  13.3 (H) 4.0 - 10.5 THOUS/MCL    RBC 4.42 3.70 - 5.00 MILL/MCL    Hgb 13.3 11.5 - 15.0 G/DL    Hematocrit 40.4 34.0 - 44.0 %    MCV 91.4 81.5 - 97.0 FL    MCH 30.1 27.0 - 33.5 PG    MCHC 32.9 32.0 - 35.5 G/DL    RDW-CV 14.8 (H) 11.6 - 14.4 %    PLT Count  80 (L) 150 - 400 THOUS/MCL    MPV 9.3 7.2 - 11.7 FL    Diff Type PERIPHERAL SMEAR WAS REVIEWED     Neutrophils % (A) 80.7 %    ANC automated 10.7 (H) 2.0 - 8.1 THOUS/MCL    Lymphocytes % 14.9 %    Lymphocytes Absolute 2.0 0.9 - 3.3 THOUS/MCL    Monocytes % 2.8 %    Monocytes Absolute 0.4 0.0 - 0.8 THOUS/MCL    Eosinophils % 1.3 %    Eosinophils Absolute 0.2 0.0 - 0.5 THOUS/MCL    Basophils % 0.3 %    Basophils Absolute 0.0 0.0 - 0.2 THOUS/MCL    RBC Morphology NORMAL     Platelet Morphology NORMAL    PT/INR/PTT    Collection Time: 03/18/22  4:37 PM   Result Value Ref Range    Prothrombin Time 13.1 12.0 - 14.2 SEC    INR 1.00 0.89 - 1.11    PTT 25.0 24.2 - 36.7 SEC   Comprehensive Metabolic Panel    Collection Time: 03/18/22  4:37 PM   Result Value Ref Range    Sodium 139 136 - 145 mmol/L    Potassium 3.6 3.5 - 5.1 mmol/L    Chloride 105 98 - 107 mmol/L    CO2 26 21 - 31 mmol/L    Electrolyte Balance 8 2 - 12 mmol/L    Glucose 98 85 - 125 mg/dL    BUN 16 7 - 25 mg/dL    Creat 0.7 0.6 - 1.2 mg/dL    eGFR - low estimate >60 >59    eGFR - high estimate >60 >59    Calcium 9.2 8.6 - 10.3 mg/dL    Protein, Total 6.4 6.0 - 8.3 G/DL    Albumin 4.0 3.7 - 5.3 G/DL    Alk Phos 40 34 - 104 U/L    AST 24 13 - 39 U/L    ALT 30 7 - 52 U/L    Bilirubin, Total 0.6 0.0 - 1.4 mg/dL       UDS/Pregnancy (if applicable): No results found for: UDS, PREG    Assessment & Plan   Shelly Neal is a 77 year old female with no previous psychiatric diagnosis who presents to ED brought in by husband voluntarily for right hip pain, incidentally expressed suicidal ideation secondary to hip pain. No UDS done.    On evaluation patient has linear thought processing and conditional SI contingent on current medical illness. Patient reports no chronic mood disturbances or underlying suicidal thinking. Given conditionality of patient's suicidal thinking, patient low risk for suicide and does not meet criteria for inpatient psychiatric  hospitalization.      Active Hospital Problems    Diagnosis   . *Osteomyelitis (CMS-HCC) [M86.9]       Differential Diagnoses:   Unspecified Depressive Disorder* - F32.9    Recommendations:  1. Safety   -- LPS/Legal Status: Patient is not on a legal hold and does not meet hold criteria.  -- Monitoring:  Patient does not require 1:1  continuous direct observation.   2. Psychiatric Medication Management:  -- No recommendations  -- Consult team to follow while admitted to medicine.  3. Medical Issues: The Emergency Medicine team is addressing the following medical issue(s):  -- Right Hip Pain  4. Disposition: Please page the on-call psychiatry resident once medical clearance has been completed. Psychiatry will then evaluate the patient for potential inpatient placement.  -- Follow Up: TBD  ----------------------------------------------------------------------------------------------------------------------    The above case was discussed by phone with attending Dr. Charlyn Minerva who agrees with the above assessment and recommendations. Thank you for including [place] in the multidisciplinary care of this patient.    Stephaie Dardis Faylene Kurtz, MD  Resident Physician

## 2022-03-18 NOTE — ED Notes (Signed)
Pt able to ambulate to bathroom with slow, steady gait. Pt states pain is improving, able to provide urine sample.

## 2022-03-18 NOTE — ED Notes (Signed)
Patient was changed out by female SHA. Valuables given to husband Product manager) and belongings placed under sink.

## 2022-03-18 NOTE — ED Notes (Signed)
Ortho MD at bedside.

## 2022-03-18 NOTE — ED Provider Notes (Signed)
CHIEF COMPLAINT:  Pelvic Pain (Pt c/o progressively getting worse 10/10 R lower hip/pelvic region x 2 weeks. Noted rashes on bilat legs, states normal when having pain. +SI,possibly due to pain)     HISTORY OF PRESENT ILLNESS:  The history was provided by: patient  Interpreter used: No (English Preferred Language)    Shelly Neal is a 76 year old female who presents with R sided pelvic pain, worsening in the last 2 weeks. She has a history of osteomyelitis 3 years ago and was treated with a surgical procedure. She was diagnosed with "nonbacterial osteomyelitis". She has a biopsy planned for Friday. She sees Ortho at [facility], her doctor is Dr. Elizebeth Koller.     She normally walks at home with her walker and has been unable to due to pain.   She tried oxy 5, CBD, Tylenol, fentanyl patches which has not controlled her pain. The pain is so bad that she is thinking about harming herself if she went home.     She also reports rashes all over her body which normally appears when she is in pain. She denies any falls, trauma.     Chart review showed:  MRI from 02/20/22 "Enhancing lesions of the right iliac bone and left sacral ala, which may be related to a chronic infectious process or a neoplastic disease such as lymphoma. Recommend correlation with prior imaging if available and biopsy if clinically indicated."          PAST MEDICAL HISTORY:  Past Medical History:   Diagnosis Date   . Cataract    . Chronic osteomyelitis, pelvic region and thigh (CMS-HCC)    . Dry eye    . Hypothyroidism    . RA (rheumatoid arthritis) (CMS-HCC)       Patient Active Problem List    Diagnosis Date Noted   . Osteomyelitis (CMS-HCC) 03/18/2022   . Chronic osteomyelitis of pelvic region, unspecified laterality (CMS-HCC) 02/16/2022   . Cataract, unspecified cataract type, unspecified laterality 08/03/2020     Added automatically from request for surgery [id number]     . Subcutaneous mass of right thumb 07/23/2020     Added automatically from request for  surgery [id number]     . Age-related nuclear cataract of both eyes 08/15/2019     Added automatically from request for surgery [id number]       SURGICAL HISTORY:  Past Surgical History:   Procedure Laterality Date   . CATARACT EXTRACTION Right 09/02/2020   . BONE RESECTION Right 2019    [facility]        ALLERGIES:  Allergies   Allergen Reactions   . Cefaclor Hives     No problems with amoxicillin/clavulanate - last taken October 2019; Other reaction(s): Pruritic rash (disorder); No problems with amoxicillin/clavulanate - last taken October 2019   . Ciprofloxacin Rash   . Diagnostic X-Ray Materials Rash   . Fd&C Blue #2 [place] Rash   . Gabapentin Nausea and Vomiting   . Hydromorphone Rash     pruritic rash to left arm - no shortness of breath;    Marland Kitchen Ibuprofen Hives and Rash     Aleve;    Marland Kitchen Meloxicam Rash   . Naproxen Rash   . Neomycin-Polymyxin B Gu Rash   . Polyethylene Glycol Hives     Other reaction(s): Pruritic rash (disorder);    Marland Kitchen Pseudoephedrine Rash   . Sulfamethoxazole W-Trimethoprim Hives and Rash     Other reaction(s): Pruritic rash (disorder)   .  Guaifenesin Unspecified   . Homeopathic Products Other   . Sulfamethoxazole Other   . Trimethoprim Other   . Iodine Other         FAMILY HISTORY:  Reviewed and considered non-contributory     SOCIAL HISTORY/DETERMINANTS OF HEALTH:  Social History     Socioeconomic History   . Marital status: Married   Tobacco Use   . Smoking status: Never   . Smokeless tobacco: Never   Substance and Sexual Activity   . Alcohol use: Never   . Drug use: Never      VITAL SIGNS:  First Vitals   Temperature Heart Rate Respirations Blood pressure (BP) SpO2   03/18/22 1606 03/18/22 1416 03/18/22 1416 03/18/22 1416 03/18/22 1416   97.8 F (36.6 C) 63 18 116/82 99 %     PHYSICAL EXAM:  General: Awake, alert, in moderate apparent distress.  Head: Normocephalic, atraumatic.  Respiratory: Breathing comfortably and in no respiratory distress. No audible wheezing or stridor.    Cardiovascular: Extremities are warm and well perfused.  Neuro: Awake and alert. Moving extremities.  Psych: Answers questions appropriately.  MSK: TTP to R iliac bone  Skin: Erythematous pruritic lesions BL  Physical Exam           MEDICAL DECISION MAKING:      Initial Impressions:  Shelly Neal is a 76 year old female with history of chronic osteomyelitis who presents with R pelvic pain.   Physical exam and vitals were notable for: afebrile, BP and HR wnl. TTP to R iliac crest   Differential diagnosis includes osteomyelitis, intractable pain and SI.     Workup Review:  Pertinent labs: elevated WBC to 13.3, positive leuk esterase     Chart review showed recent MRI from 02/20/22 which revealed enhancing lesions of the right iliac bone and left sacral ala.     Patient's intractable pain is likely due to her chronic osteomyelitis. Ortho was consulted and they will follow up with her outpatient after the biopsy. Patient requires inpatient admission due to SI and failing outpatient pain management.      Disposition Decision:  Admission                                                The patient's condition of SI and intractable pain requires further management and intervention. They will benefit from inpatient hospitalization. Sign-out was given to the accepting team with the information available at the time of admission. I relayed my concerns and recommendations.        "ED Course" is listed below that provides real time documentation during ER encounter and will provide further context to the MDM discussion above.      DIAGNOSIS:    ICD-10-CM ICD-9-CM   1. Intractable pain  R52 780.96   2. Gait instability  R26.81 781.2           ED COURSE/PATIENT REASSESSMENTS:  Workup Summary       Value Comment By Time      Had an extensive conversation with the patients, she reports unsafe to leave the hospital. Reports fear of harming herself due to the pain. Ordered level 2 Shelly Gamer, MD 07/05 [phone]      Patient reports  feeling better after morphine Shelly Gamer, MD 07/05 Jamal Maes      Raquel Sarna MD) called back  and will not be intervening. Recommend admitting for better pain control. Will f/u outpatient in July after biopsy results.  Shelly Gamer, MD 07/05 1723      Called caseworker and will admit patient to inpatient obs. Still waiting on call back  Shelly Gamer, MD 07/05 1847      IP Obs agreed to admit the patient Shelly Gamer, MD 07/05 2002      Patient admitted to medicine team but they were not comfortable Taking patient off level 2. Discontinued per Psych note 03-18-22 Milly Jakob, MD 07/06 [phone]         The following work up was performed:  Orders Placed This Encounter   Procedures   . CBC w/ Diff Lavender   . PT/INR/PTT   . Comprehensive Metabolic Panel   . Urinalysis   . Drug Screen Rapid Panel 12 No Confirmation, Urine   . CBC w/ Diff Lavender   . Comprehensive Metabolic Panel   . Magnesium, Blood Green Plasma Separator Tube   . IP Consult to Orthopedic Surgery   . IP Consult to Psychiatry   . IP Consult to Orthopedic Surgery     Medications ordered in this Encounter   Medications   . DISCONTD: morphine injection 4 mg   . ondansetron (ZOFRAN ODT) disintegrating tablet 4 mg   . ondansetron (ZOFRAN ODT) disintegrating tablet 4 mg   . OR Linked Order Group    . acetaminophen (TYLENOL) tablet 650 mg    . acetaminophen (TYLENOL) suppository 650 mg   . senna (SENOKOT) tablet 8.6 mg   . OR Linked Order Group    . ondansetron (ZOFRAN) tablet 4 mg    . ondansetron (ZOFRAN) injection 4 mg   . aluminum hydroxide-magnesium hydroxide-simethicone (MAG-AL PLUS XS) 400-400-40 MG/5ML suspension 15 mL   . DISCONTD: morphine injection 2 mg   . DISCONTD: oxyCODONE (ROXICODONE) tablet 5 mg   . DISCONTD: oxyCODONE (ROXICODONE) tablet 10 mg   . morphine injection 4 mg   . acetaminophen (TYLENOL) tablet 975 mg   . oxyCODONE (ROXICODONE) tablet 10 mg   . oxyCODONE (ROXICODONE) tablet 15 mg   . diphenhydrAMINE (BENADRYL) tablet or capsule 25  mg   . cetirizine (ZYRTEC) tablet 10 mg   . folic acid (FOLVITE) tablet 1 mg   . levothyroxine (SYNTHROID) tablet 88 mcg   . predniSONE (DELTASONE) tablet 10 mg   . sertraline (ZOLOFT) tablet 100 mg        Shelly Gamer, MD  Resident  03/19/22 1205    ATTENDING ATTESTATION:  I evaluated the patient concurrently with the Resident/Fellow.  I discussed the case with the Resident/Fellow and agree with the findings and plan as documented by the Resident/Fellow.  Any additions or revisions are included in the record as necessary.    Myrtice Lauth, MD              Myrtice Lauth, MD  03/20/22 [phone]

## 2022-03-18 NOTE — ED Notes (Signed)
Psych team at bedside .

## 2022-03-18 NOTE — H&P (Addendum)
Admission History and Physical [facility] Medicine        Date of Admission: 03/18/2022    Date and Time of NP  Evaluation: 03/18/2022, 7:56 PM    Primary Care Physician:  Johnanna Schneiders    Chief Complaint: chief complaint: right hip pain     History of Present Illness:  Shelly Neal is a 76 year old female with history of complex pelvic pain issues, including previous surgery for "nonbacterialosteomyelitis of the pelvis," 3 years ago at [facility] presenting with right hip pain. Patient states at baseline she ambulates with walker but pain has bee worsening for the past 2 weeks. She has been taking oxycodone 68mQ3-4H, alternating with tylenol and CBD without much relief. She has follows outpatient pain management specialist and has tried fentanyl patches which has not controlled her pain. The pain is so severe that she thought about harming self without a plan. Currently patient denies any SI/HI. Denies any fevers, chills, N/V, abdominal pain or dysuria.     Chart review showed:  MRI from 02/20/22 showed "Enhancing lesions of the right iliac bone and left sacral ala, which may be related to a chronic infectious process or a neoplastic disease such as lymphoma. Recommend correlation with prior imaging if available and biopsy if clinically indicated."    ED course:   VSS, afebrile, normotensive. Laboratory work up unremarkable except wbc 13. Urinalysis with large leukocytes, WBC 182.   Psychiatry consulted in ED. Patient will be admitted to observation for pain control.         Past Medical/Surgical History:  Patient Active Problem List   Diagnosis   . Age-related nuclear cataract of both eyes   . Subcutaneous mass of right thumb   . Cataract, unspecified cataract type, unspecified laterality   . Chronic osteomyelitis of pelvic region, unspecified laterality (CMS-HCC)   . Osteomyelitis (CMS-HCC)   .   Past Surgical History:   Procedure Laterality Date   [facility]  . CATARACT  EXTRACTION Right 09/02/2020   .     Family History:  [place] any [place]  for heart disease, DM, cancer  No family history on file.    Social History:  Social History     Socioeconomic History   . Marital status: Married     Spouse name: Not on file   . Number of children: Not on file   . Years of education: Not on file   . Highest education level: Not on file   Occupational History   . Not on file   Tobacco Use   . Smoking status: Never   . Smokeless tobacco: Never   Substance and Sexual Activity   . Alcohol use: Never   . Drug use: Never   . Sexual activity: Not on file   Other Topics Concern   . Not on file   Social History Narrative   . Not on file     Social Determinants of Health     Financial Resource Strain: Not on file   Food Insecurity: Not on file   Transportation Needs: Not on file   Physical Activity: Not on file   Stress: Not on file   Social Connections: Not on file   Intimate Partner Violence: Not on file   Housing Stability: Not on file         Immunization History:    There is no immunization history on file for this patient.  Allergies:  Allergies   Allergen Reactions   . Cefaclor Hives     Other reaction(s): Pruritic rash (disorder)  Other reaction(s): Pruritic rash (disorder)  No problems with amoxicillin/clavulanate - last taken October 2019  Other reaction(s): Pruritic rash (disorder)  Other reaction(s): Pruritic rash (disorder)  Other reaction(s): Pruritic rash (disorder)    Other reaction(s): Unknown  Other reaction(s): Pruritic rash (disorder)  Other reaction(s): Pruritic rash (disorder)  Other reaction(s): Pruritic rash (disorder)  Other reaction(s): Pruritic rash (disorder)  No problems with amoxicillin/clavulanate - last taken October 2019  Other reaction(s): Pruritic rash (disorder)  Other reaction(s): Pruritic rash (disorder)  No problems with amoxicillin/clavulanate - last taken October 2019  Other reaction(s): Pruritic rash (disorder)  Other reaction(s): Pruritic rash  (disorder)  Other reaction(s): Pruritic rash (disorder)    No problems with amoxicillin/clavulanate - last taken October 2019  Other reaction(s): Pruritic rash (disorder)  No problems with amoxicillin/clavulanate - last taken October 2019  No problems with amoxicillin/clavulanate - last taken October 2019  Other reaction(s): Pruritic rash (disorder)  No problems with amoxicillin/clavulanate - last taken October 2019  No problems with amoxicillin/clavulanate - last taken October 2019  Other reaction(s): Pruritic rash (disorder)  No problems with amoxicillin/clavulanate - last taken October 2019    No problems with amoxicillin/clavulanate - last taken October 2019; Other reaction(s): Pruritic rash (disorder); No problems with amoxicillin/clavulanate - last taken October 2019   . Ciprofloxacin Rash   . Diagnostic X-Ray Materials Rash   . Fd&C Blue #2 [place] Rash   . Gabapentin Nausea and Vomiting   . Hydromorphone Rash     pruritic rash to left arm - no shortness of breath  pruritic rash to left arm - no shortness of breath  pruritic rash to left arm - no shortness of breath  pruritic rash to left arm - no shortness of breath  pruritic rash to left arm - no shortness of breath    pruritic rash to left arm - no shortness of breath  pruritic rash to left arm - no shortness of breath  pruritic rash to left arm - no shortness of breath  pruritic rash to left arm - no shortness of breath  pruritic rash to left arm - no shortness of breath  pruritic rash to left arm - no shortness of breath  pruritic rash to left arm - no shortness of breath  pruritic rash to left arm - no shortness of breath  pruritic rash to left arm - no shortness of breath  pruritic rash to left arm - no shortness of breath  pruritic rash to left arm - no shortness of breath  pruritic rash to left arm - no shortness of breath  pruritic rash to left arm - no shortness of breath    pruritic rash to left arm - no shortness of breath;    Marland Kitchen Ibuprofen  Hives and Rash     Aleve    Aleve  Aleve  Aleve  Aleve  Aleve    Aleve;    Marland Kitchen Meloxicam Rash   . Naproxen Rash   . Neomycin-Polymyxin B Gu Rash   . Polyethylene Glycol Hives     Other reaction(s): Pruritic rash (disorder)  Other reaction(s): Pruritic rash (disorder)  Other reaction(s): Pruritic rash (disorder)  Other reaction(s): Pruritic rash (disorder)    Other reaction(s): Pruritic rash (disorder)  Other reaction(s): Pruritic rash (disorder)  Other reaction(s): Pruritic rash (disorder)  Other reaction(s): Pruritic rash (disorder)  Other  reaction(s): Pruritic rash (disorder)  Other reaction(s): Pruritic rash (disorder)  Other reaction(s): Pruritic rash (disorder)  Other reaction(s): Pruritic rash (disorder)  Other reaction(s): Pruritic rash (disorder)  Other reaction(s): Pruritic rash (disorder)  Other reaction(s): Pruritic rash (disorder)    Other reaction(s): Pruritic rash (disorder);    Marland Kitchen Pseudoephedrine Rash   . Sulfamethoxazole W-Trimethoprim Hives and Rash     Other reaction(s): Pruritic rash (disorder)  Other reaction(s): Pruritic rash (disorder)    Other reaction(s): Unknown  Other reaction(s): Pruritic rash (disorder)  Other reaction(s): Pruritic rash (disorder)  Other reaction(s): Pruritic rash (disorder)  Other reaction(s): Pruritic rash (disorder)  Other reaction(s): Pruritic rash (disorder)  Other reaction(s): Pruritic rash (disorder)  Other reaction(s): Pruritic rash (disorder)    Other reaction(s): Pruritic rash (disorder)  Other reaction(s): Pruritic rash (disorder)    Other reaction(s): Pruritic rash (disorder)  Other reaction(s): Pruritic rash (disorder)  Other reaction(s): Pruritic rash (disorder)    Other reaction(s): Pruritic rash (disorder)   . Guaifenesin Unspecified   . Homeopathic Products Other   . Sulfamethoxazole Other   . Trimethoprim Other   . Iodine Other       Medications:  HOME MEDS:  No outpatient medications have been marked as taking for the 03/18/22 encounter [facility] Encounter).    .   . senna  8.6 mg HS   .   Marland Kitchen acetaminophen  650 mg Q4H PRN    Or   . acetaminophen  650 mg Q4H PRN   . aluminum hydroxide-magnesium hydroxide-simethicone  15 mL Q6H PRN   . morphine  2 mg Q3H PRN   . ondansetron  4 mg Q6H PRN    Or   . ondansetron  4 mg Q6H PRN   . oxyCODONE  10 mg Q4H PRN   . oxyCODONE  5 mg Q4H PRN   .     Review of Systems:    Constitutional: Denies fever, chills, weight loss, weight gain  Head & Neck:  Denies headache, vision change, hearing change, rhinorrhea, sore throat  Respiratory:  Denies shortness of breath, cough, sputum, hemoptysis  Cardiovascular:  Denies chest pain/pressure, orthopnea, PND, palpitations  Gastrointestinal:  Denies abdominal pain, nausea, vomiting, diarrhea, melena, rectal bleeding  Genitourinary:  Denies dysuria, polyuria, hesitancy, frequency  Musculoskeletal:  Denies joint pain, leg edema  Neurological:  Denies focal numbness, weakness, seizure  All other Review of Systems is negative.       Physical Exam:  Vital Signs: No data found.,     Height:  ,     BMI: Body mass index is 24.37 kg/m.    General: 76 year old in moderate distress secondary to pain  HEENT: normocephalic, atraumatic, anicteric sclera, neck supple  Card: normal S1 S2, regular rate & rhythm, no murmurs  Pulm: CTAB, normal respiratory effort, no wheezes  Abd: soft, nontender, nondistended, no guarding rigidity or rebound tenderness  Skin: Rashes to LE   Ext: warm, well perfused, no edema, Right hip pain   Neuro: A&O x3, no focal deficits  Psych: normal mood and affect      Diagnostic Data:  Nursing Notes Reviewed:  No intake or output data in the 24 hours ending 03/18/22 1956    No data found.         Laboratory Data:    Admission on 03/18/2022   Component Date Value   . White Bld Cell Count 03/18/2022 13.3 (H)    . RBC 03/18/2022 4.42    . Hgb 03/18/2022  13.3    . Hematocrit 03/18/2022 40.4    . MCV 03/18/2022 91.4    . [place] 03/18/2022 30.1    . MCHC 03/18/2022 32.9    . RDW-CV 03/18/2022 14.8  (H)    . PLT Count 03/18/2022 80 (L)    . MPV 03/18/2022 9.3    . Diff Type 03/18/2022 PERIPHERAL SMEAR WAS REVIEWED    . Neutrophils % (A) 03/18/2022 80.7    . ANC automated 03/18/2022 10.7 (H)    . Lymphocytes % 03/18/2022 14.9    . Lymphocytes Absolute 03/18/2022 2.0    . Monocytes % 03/18/2022 2.8    . Monocytes Absolute 03/18/2022 0.4    . Eosinophils % 03/18/2022 1.3    . Eosinophils Absolute 03/18/2022 0.2    . Basophils % 03/18/2022 0.3    . Basophils Absolute 03/18/2022 0.0    . RBC Morphology 03/18/2022 NORMAL    . Platelet Morphology 03/18/2022 NORMAL    . Prothrombin Time 03/18/2022 13.1    . INR 03/18/2022 1.00    . PTT 03/18/2022 25.0    . Sodium 03/18/2022 139    . Potassium 03/18/2022 3.6    . Chloride 03/18/2022 105    . CO2 03/18/2022 26    . Electrolyte Balance 03/18/2022 8    . Glucose 03/18/2022 98    . BUN 03/18/2022 16    . Creat 03/18/2022 0.7    . eGFR - low estimate 03/18/2022 >60    . eGFR - high estimate 03/18/2022 >60    . Calcium 03/18/2022 9.2    . Protein, Total 03/18/2022 6.4    . Albumin 03/18/2022 4.0    . Alk Phos 03/18/2022 40    . AST 03/18/2022 24    . ALT 03/18/2022 30    . Bilirubin, Total 03/18/2022 0.6        No results found for: A1C    No results found for: TSH; fT4: No results found for: FREET4    {HIV: No components found for: HIV    {UA: No results found for: COLORUA, APPEARUA, GLUCOSEUA, BILIUA, KETONEUA, SGUA, BLOODUA, PHUA, PROTEINUA, UROBILUA, NITRITEUA, LEUKESTUA, WBCUA, RBCUA, EPITHCELLSUA, HYALINEUA, CRYSTALSUA, COMMENTSUA    No results found for: BLOODCULT, FUNGALBC, AFBBACTCULT, URINECULTURE, QTFERON, QUANTIFERON, CRYPTOAG, CRYPTOCAGCSF, COCCICF, COCCICFCSF, COCCIIMMDIIF, COCCIIMMCSF, HISTOAGUR, CMVDNAQT, CMVDNAQTCSF      Other Data:    MRI Pelvis Bone W/WO Contrast    Result Date: 02/27/2022  Enhancing lesions of the right iliac bone and left sacral ala, which may be related to a chronic infectious process or a neoplastic disease such as lymphoma. Recommend  correlation with prior imaging if available and biopsy if clinically indicated. PET/CT may also provide additional information. I have personally reviewed the images upon which this report is based and agree with the findings and conclusions expressed above.    CT Chest W/O Contrast    Result Date: 02/22/2022  Lucent changes in the T5, T9, T10, and T11 vertebral bodies, nonspecific and may be hemangiomas. Sclerotic changes in the T5 vertebral body, more than expected for degenerative change. Recommend MR thoracic spine without and with intravenous contrast for better evaluation. 6 mm pleural-based right lower lobe nodule. Few bilateral pleural-based and nonpleural-based micronodules. Linear scarring/atelectasis in bilateral lower lobes. Peribronchial wall thickening. Scattered mucous plugging. 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 Small pericardial effusion. Chronic findings, as above.     CT Abdomen And Pelvis W/O Contrast    Result Date: 02/21/2022  1.  Multiple  sclerotic bone lesions, concerning for metastatic malignancy. Evaluation for neoplastic process is limited by lack of intravenous contrast. No abnormality suspicious for malignancy identified in the abdomen and pelvis at noncontrast CT. END           Impression:  Shelly Neal is a 76 year old female with history of complex pelvic pain issues, including previous surgery for "nonbacterialosteomyelitis of the pelvis," 3 years ago at [facility] presenting with right hip pain.    Assessment:  #chronic right pelvic/hip pain 2/2 chronic osteomyelitis of the pelvis since 2020? Patient [facility] and pain management. Patient pending right iliac bone mass biopsy, cryoablation and SI joint CSI scheduled with Dr. Luciana Axe, requires further work up of pulmonary nodules before she undergoes procedure.  #Enhancing lesions of the right iliac bone and left sacra ala, concerning for infectious process vs neoplastic disease.   #Suicidial ideation, due to  worsening plan. Psych consulted in ED. Patient does not meet hold criteria or 1:1 continuous direct observation.   #Urinary bacteremia, asymptomatic.   #RA, followed by outpatient rheumatologist. Patient with LE rashes, taking prednisone daily.     Plan:  Admit to Observation   Pain control: oxycodone 5/45m PRN, morphine breakthrough pain   Consider pain management consult if pain is not controlled  Ortho consulted, no orthopaedic intervention at this time.   Consider IR for right iliac bone mass biopsy, cryoablation and SI joint CSI while inpatient.   Reconcile home medications as patient in severe pain at this time  Trend labs       VTE Risk/Prevention: SCDs    Code Status: Full Code         Discussed the H&P, reviewed diagnostic studies and formulated plan of care/disposition with the Supervising physician Dr. MBettina Gaviawho has seen and evaluated the pt, the attending agrees with the above unless noted in their separate documentation.      Best Regards,  Enmanuel Zufall M. HJerilee Hoh MSN, APRN, AGACNP-BC  UMarathon OilNP      I attest that I have advised the patient that I am not a physician or surgeon and that I am a nurse practitioner. I have also advised the patient that they may request and will be allowed to see a physician. In addition, I have advised the patient that in the event of any change in their condition, they will be referred to a physician.     NP Hospitalist    ATTENDING ATTESTATION:    Date and Time of Attending Evaluation:  03/18/2022, 7:56 PM

## 2022-03-18 NOTE — ED Notes (Signed)
[facility] NP at bedside assessing pt.

## 2022-03-18 NOTE — ED Notes (Signed)
Pt given dinner tray.

## 2022-03-19 DIAGNOSIS — M25551 Pain in right hip: Secondary | ICD-10-CM

## 2022-03-19 LAB — COMPREHENSIVE METABOLIC PANEL, BLOOD
ALT: 29 U/L (ref 7–52)
AST: 22 U/L (ref 13–39)
Albumin: 3.9 G/DL (ref 3.7–5.3)
Alk Phos: 41 U/L (ref 34–104)
BUN: 16 mg/dL (ref 7–25)
Bilirubin, Total: 0.5 mg/dL (ref 0.0–1.4)
CO2: 25 mmol/L (ref 21–31)
Calcium: 8.8 mg/dL (ref 8.6–10.3)
Chloride: 103 mmol/L (ref 98–107)
Creat: 0.8 mg/dL (ref 0.6–1.2)
Electrolyte Balance: 10 mmol/L (ref 2–12)
Glucose: 108 mg/dL (ref 85–125)
Potassium: 3.6 mmol/L (ref 3.5–5.1)
Protein, Total: 6.2 G/DL (ref 6.0–8.3)
Sodium: 138 mmol/L (ref 136–145)
eGFR - high estimate: >60 (ref >59)
eGFR - low estimate: >60 (ref >59)

## 2022-03-19 LAB — CBC WITH DIFF, BLOOD
ANC automated: 13.2 10*3/uL — ABNORMAL HIGH (ref 2.0–8.1)
Basophils %: 0.3 %
Basophils Absolute: 0 10*3/uL (ref 0.0–0.2)
Eosinophils %: 1.1 %
Eosinophils Absolute: 0.2 10*3/uL (ref 0.0–0.5)
Hematocrit: 39.7 % (ref 34.0–44.0)
Hgb: 13.6 G/DL (ref 11.5–15.0)
Lymphocytes %: 14.5 %
Lymphocytes Absolute: 2.3 10*3/uL (ref 0.9–3.3)
MCH: 31.4 PG (ref 27.0–33.5)
MCHC: 34.3 G/DL (ref 32.0–35.5)
MCV: 91.8 FL (ref 81.5–97.0)
MPV: 9.8 FL (ref 7.2–11.7)
Monocytes %: 2.4 %
Monocytes Absolute: 0.4 10*3/uL (ref 0.0–0.8)
Neutrophils % (A): 81.7 %
PLT Count: 87 10*3/uL — ABNORMAL LOW (ref 150–400)
RBC: 4.33 10*6/uL (ref 3.70–5.00)
RDW-CV: 14.7 % — ABNORMAL HIGH (ref 11.6–14.4)
White Bld Cell Count: 16.2 10*3/uL — ABNORMAL HIGH (ref 4.0–10.5)

## 2022-03-19 LAB — MAGNESIUM, BLOOD: Magnesium: 1.9 mg/dL (ref 1.9–2.7)

## 2022-03-19 MED ORDER — LEVOTHYROXINE SODIUM 88 MCG OR TABS
88.0000 ug | ORAL_TABLET | Freq: Every day | ORAL | Status: DC
Start: 2022-03-19 — End: 2022-03-20
  Administered 2022-03-19: 88 ug via ORAL
  Filled 2022-03-19: qty 1

## 2022-03-19 MED ORDER — ACETAMINOPHEN 500 MG OR TABS
1000.0000 mg | ORAL_TABLET | Freq: Three times a day (TID) | ORAL | 0 refills | Status: DC
Start: 2022-03-19 — End: 2022-03-21

## 2022-03-19 MED ORDER — CETIRIZINE HCL 5 MG OR TABS
10.0000 mg | ORAL_TABLET | Freq: Every day | ORAL | Status: DC
Start: 2022-03-19 — End: 2022-03-20
  Administered 2022-03-19: 10 mg via ORAL
  Filled 2022-03-19: qty 2

## 2022-03-19 MED ORDER — FOLIC ACID 1 MG OR TABS
1.0000 mg | ORAL_TABLET | Freq: Every day | ORAL | Status: DC
Start: 2022-03-19 — End: 2022-03-20
  Administered 2022-03-19: 1 mg via ORAL
  Filled 2022-03-19: qty 1

## 2022-03-19 MED ORDER — TIZANIDINE HCL 2 MG OR TABS
2.0000 mg | ORAL_TABLET | Freq: Two times a day (BID) | ORAL | 0 refills | Status: DC | PRN
Start: 2022-03-19 — End: 2022-03-21

## 2022-03-19 MED ORDER — OXYCODONE HCL 15 MG OR TABS
15.0000 mg | ORAL_TABLET | Freq: Three times a day (TID) | ORAL | 0 refills | Status: AC | PRN
Start: 2022-03-19 — End: ?

## 2022-03-19 MED ORDER — SERTRALINE HCL 100 MG OR TABS
100.0000 mg | ORAL_TABLET | Freq: Every day | ORAL | Status: DC
Start: 2022-03-19 — End: 2022-03-20
  Administered 2022-03-19: 100 mg via ORAL
  Filled 2022-03-19: qty 1

## 2022-03-19 MED ORDER — PREDNISONE 10 MG OR TABS
10.0000 mg | ORAL_TABLET | Freq: Every day | ORAL | Status: DC
Start: 2022-03-19 — End: 2022-03-20
  Administered 2022-03-19: 10 mg via ORAL
  Filled 2022-03-19: qty 1

## 2022-03-19 MED ORDER — DIPHENHYDRAMINE HCL 25 MG OR TABS OR CAPS
25.0000 mg | ORAL_CAPSULE | ORAL | Status: DC | PRN
Start: 2022-03-19 — End: 2022-03-20
  Administered 2022-03-19: 25 mg via ORAL
  Filled 2022-03-19: qty 1

## 2022-03-19 MED ORDER — METHOCARBAMOL 500 MG OR TABS
500.0000 mg | ORAL_TABLET | Freq: Four times a day (QID) | ORAL | Status: DC
Start: 2022-03-19 — End: 2022-03-20
  Administered 2022-03-19: 500 mg via ORAL
  Filled 2022-03-19 (×2): qty 1

## 2022-03-19 MED ORDER — SENNA 8.6 MG OR TABS
8.6000 mg | ORAL_TABLET | Freq: Every evening | ORAL | 0 refills | Status: DC
Start: 2022-03-19 — End: 2022-03-21

## 2022-03-19 NOTE — ED Notes (Signed)
Observed Pt ambulatory with steady gait to restroom and back to room.

## 2022-03-19 NOTE — Telephone Encounter (Signed)
Medication was sent to pharmacy, patient read message.     Shelly Neal,LVN.

## 2022-03-19 NOTE — Interdisciplinary (Signed)
Occupational Therapy Evaluation and Discharge    Admitting Physician:  Herbie Drape, MD  Admission Date 03/18/2022    Inpatient Diagnosis:   Problem List       Codes    Intractable pain    -  Primary ICD-10-CM: R52  ICD-9-CM: 780.96    Gait instability     ICD-10-CM: R26.81  ICD-9-CM: 781.2          IP Start of Service  Start of Care: 03/19/22  Reason for referral: Decline in functional ability/mobility;Decline in performance of activities of daily living (ADL)    Preferred [place]         Past Medical History:   Diagnosis Date   . Cataract    . Chronic osteomyelitis, pelvic region and thigh (CMS-HCC)    . Dry eye    . Hypothyroidism    . RA (rheumatoid arthritis) (CMS-HCC)       Past Surgical History:   Procedure Laterality Date   . CATARACT EXTRACTION Right 09/02/2020   . BONE RESECTION Right 2019    [place] Name 03/19/22 1500          Type of Visit    Type of Occupational Therapy note Occupational Therapy Evaluation and Discharge     [place] Name 03/19/22 1500          Treatment Time    Treatment Start Time 1430     Total TIMED Treatment (min) 30  25     Total Treatment Time (min) 47     Row Name 03/19/22 1500          Treatment Precautions/Restrictions    Precautions/Restrictions Fall     Fall Socks/charm     Row Name 03/19/22 1500          Medical History    History of presenting condition Per chart "76 year old female with history of complex pelvic pain issues, including previous surgery for "nonbacterial osteomyelitis of the pelvis," 3 years ago at [facility] presenting with right hip pain."     Fall history No falls reported in the last 6 months     [place] Name 03/19/22 1500          Functional History    Prior Level of Function No deficits     General ADL/Self-Care Assistance Needs None- Independent with ADLs and self care     Equipment required for mobility in the home None     Row Name 03/19/22 1500          Social History    Living Situation Lives with spouse/partner      [place]  tri level     Home accessibility Other (Comment)  chair lift     Bathroom accessibility Walk in shower;Grab bars;Shower/tub seat     Row Name 03/19/22 1500          Subjective    Subjective information RN [place] clered pt for OT eval. Pt received in bed, agreeable to tx     Patient status Patient agreeable to treatment;Nursing in agreement for treatment     Row Name 03/19/22 1500          Pain Assessment    Pain Asssessment Tool Numeric Pain Rating Scale     Row Name 03/19/22 1500          Numeric Pain Rating Scale    Pain Intensity - rating at  present 5     Pain Intensity- rating after treatment 5     Location R pelvis     Row Name 03/19/22 1500          Activities of Daily Living (ADLs)    Self Feeding Independent     Self Grooming Independent     Upper Body Dressing Independent     Lower Body Dressing Independent     [place]     Toilet Transfers Supervised     Row Name 03/19/22 1500          [place] AM-PAC: Daily Activity    Assistance Needed to Put on and Take off Regular Lower Body Clothing 4     Assistance Needed to Bathe, Including Washing, Rinsing, and Drying 4     Assistance Needed to Toilet [employer], Bedpan, or Urinal) 4     Assistance Needed to Put on and Take off Regular Upper Body Clothing 4     Assistance Needed to Take Care of Personal Grooming Such as Brushing Teeth 4     Assistance Needed to Eat Meals 4     AM-PAC Daily Activity Total Score 24     AMP-PAC Daily Activity Impairment rating Score 24 - 0% impaired     Row Name 03/19/22 1500          Objective    Overall Cognitive Status Intact - no cognitive limitations or impairments noted     Communication No communication limitations or impairments noted. Current status of hearing, speech and vision allow functional communication.     Coordination/Motor control No limitations or impairments noted. Movement patterns are fluid and coordinated throughout     Balance Balance limitations  present     Static Sitting Balance Normal - able to maintain steady balance without handhold support     Dynamic Sitting Balance Good - accepts moderate challenge, able to maintain balance while picking object off floor     Static Standing Balance Good - able to maintain balance without handhold support, limited postural sway     Dynamic Standing Balance Good - accepts moderate challenge, able to maintain balance while picking object off floor     Extremity Assessment Flexibility, strength, muscle tone and sensation grossly within functional limits throughout     Functional Mobility No limitations or impairments in functional mobility noted. Patient independent in mobility activities of daily living     Bed Mobility Independent     Transfers to/from Stand Independent     Ambulation during functional tasks Supervised     Device used for ambulation/mobility Front wheeled walker     Ambulation Distance bed to/from bathroom     Other Objective Findings Ambulated to bathroom and completed toileting. No loss of balane or postural sway. Edcuated on activity pacing with ADLs. Left in bed with call light and all needs met. RN updated                OT Acute Tool Box     Row Name 03/19/22 1500          Cognition Assessment    Overall Cognitive Status Intact - no cognitive limitations or impairments noted                    Eval cont.     [place] Name 03/19/22 1500          Patient/Family Education    Learner(s) Patient  Learner response to rehab patient education interventions Verbalizes understanding     [place] Name 03/19/22 1500          Assessment    Assessment Pt is independent in ADLs at baseline. Based on today's assessment, pt has no acute limitations limiting independence in ADLs. No skilled OT needed at this time     [place] Name 03/19/22 1500          Treatment Plan Disussion    Treatment Plan Discussion and Agreement Patient/family/caregiver stated understanding and agreement with the therapy plan     Row Name 03/19/22  1500          Treatment Plan    Duration of treatment (number of visits) One time only, further treatment not indicated     Status of treatment One time only treatment, further skilled therapy not indicated     [place] Name 03/19/22 1500          Patient Safety Considerations    Patient safety considerations Patient returned to bed at end of treatment;Call light left in reach and fall precautions in place;Nursing notified of safety considerations at end of treatment     Patient assistive device requirements for safe ambulation No device required     [place] Name 03/19/22 1500          Post Acute Discharge Recommendations    Discharge Rehabilitation Recommendations None- patient currently  has no further skilled therapy needs     Equipment recommendations No equipment needed - patient has own equipment     [place] Name 03/19/22 1500          Therapy Plan Communication    Therapy Plan Communication Discussed therapy plan with Nursing and/or Physician;Encouraged out of bed with assistance by     Encouraged out of bed with assistance by Nursing;Staff     [place] Name 03/19/22 1500          Occupational Therapy Patient Discharge Instructions    Your Occupational Therapist suggests the following Continue to complete your self care Activities of Daily Living as frequently as possible;Supervision is suggested when you     Supervision is suggested when you bath;dress;toilet     Row Name 03/19/22 1500          Type of Eval    Moderate Complexity [id number]) Completed     Row Name 03/19/22 1500          Therapeutic Procedures    Self-Care/ADL Training [id number]) Activities of daily living training;Grooming;Patient education;Personal hygiene;Self-care activities of dally living         Total TIMED Treatment (min) 10                 The occupational therapist of record is endorsed by evaluating occupational therapist.

## 2022-03-19 NOTE — ED Notes (Signed)
Covering primary nurse Joie RN for break. Patient asleep, no distress noted.

## 2022-03-19 NOTE — Discharge Summary (Signed)
DISCHARGE SUMMARY     Patient Name:  Shelly Neal    Date of Admission:  03/18/2022  Date of Discharge:  03/19/22  Discharge Disposition:  Home     Principal Diagnosis:  Acute on chronic right hip pain      Follow Up Recommendations:  - Please take tylenol 1000 mg every 8 hours, please take oxycodone 10 or 15 mg as needed for moderate (4-6) or severe pain (7-10 level pain) respectively.    - You have also been prescribed a bowel regimen of senna, can also take benefiber as opioids like oxycodone can cause constipation  - Please follow the instructions given to you for your right hip bone biopsy with Dr Luciana Axe and please tell anesthesia which pain meds you took at what time.   - Please continue your home medications  - Please follow-up with your PCP  - Please see your upcoming future appointments below    Follow Up Appointments:  Scheduled appointments:  Future Appointments   Date Time Provider [place]   03/20/2022 12:00 PM [facility]   03/30/2022  8:00 AM Learned, Elyn Aquas, MD [facility] Marcelline Deist     - You will be called in regards to future appointments  - If you do not receive a call for an appointment within 10 days, please call [phone] to schedule an appointment        Discharge Medications:     What To Do With Your Medications      START taking these medications      Add'l Info   oxyCODONE 15 MG immediate release tablet  Commonly known as: ROXICODONE  Take 1 tablet (15 mg) by mouth every 8 hours as needed for Moderate Pain (Pain Score 4-6) or Severe Pain (Pain Score 7-10) for up to 21 doses.  Replaces: oxyCODONE 5 MG capsule   Quantity: 21 tablet  Refills: 0     senna 8.6 MG tablet  Commonly known as: SENOKOT  Take 1 tablet (8.6 mg) by mouth at bedtime.   Quantity: 30 tablet  Refills: 0     tizanidine 2 MG tablet  Commonly known as: ZANAFLEX  Take 1 tablet (2 mg) by mouth 2 times daily as needed (muscle spasm right hip).   Quantity: 69 tablet  Refills: 0        CHANGE how you  take these medications      Add'l Info   acetaminophen 500 MG tablet  Commonly known as: TYLENOL  Take 2 tablets (1,000 mg) by mouth every 8 hours.   Quantity: 30 tablet  Refills: 0  What changed:    how much to take   when to take this   reasons to take this   Another medication with the same name was removed. Continue taking this medication, and follow the directions you see here.     Azelastine HCl 137 MCG/SPRAY Soln  SPRAY 1 SPRAY INTO EACH NOSTRIL TWICE A DAY   Refills: 0  What changed: Another medication with the same name was removed. Continue taking this medication, and follow the directions you see here.     budesonide-formoterol 160-4.5 MCG/ACT inhaler  Commonly known as: SYMBICORT  Inhale 2 puffs by mouth as needed.   Refills: 0  What changed: Another medication with the same name was removed. Continue taking this medication, and follow the directions you see here.     cetirizine 5 MG tablet  Commonly known as: ZYRTEC  Take 2 tablets (10 mg) by mouth.   Refills: 0  What changed: Another medication with the same name was removed. Continue taking this medication, and follow the directions you see here.     cycloSPORINE 0.05 % ophthalmic emulsion  Commonly known as: RESTASIS  Place 1 drop into both eyes 2 times daily.   Quantity: 2 each  Refills: 0  What changed: Another medication with the same name was removed. Continue taking this medication, and follow the directions you see here.     methotrexate 250 MG/10ML Soln injection  Commonly known as: RHEUMATREX  INJECT 0.8 ML EVERY 7 DAYS--MULTIDOSE VIALS   Refills: 0  What changed: Another medication with the same name was removed. Continue taking this medication, and follow the directions you see here.     predniSONE 5 MG tablet  Commonly known as: DELTASONE  Take 2 tablets (10 mg) by mouth daily.   Refills: 0  What changed: Another medication with the same name was removed. Continue taking this medication, and follow the directions you see here.      sertraline 100 MG tablet  Commonly known as: ZOLOFT  Take 1 tablet (100 mg) by mouth daily.   Refills: 0  What changed: Another medication with the same name was removed. Continue taking this medication, and follow the directions you see here.        CONTINUE taking these medications      Add'l Info   Actemra 162 MG/0.9ML Sosy injection  Inject 0.9 mL (162 mg) under the skin every 7 days.  Generic drug: tocilizumab   Refills: 0     albuterol 108 (90 Base) MCG/ACT inhaler  Inhale 1 puff by mouth every 6 hours as needed for Shortness of Breath or Wheezing.   Refills: 0     anterior blue eye kit  USED AS INSTRUCTED BY YOUR PHYSICIAN (KIT FOR RIGHT EYE)   Quantity: 1 kit  Refills: 0     calcium carbonate 500 MG tablet  1 tablet   Refills: 0     ergocalciferol 50000 UNIT capsule  Commonly known as: VITAMIN D  TAKE 1 CAPSULE BY MOUTH WEEKLY FOR 8 WEEKS   Refills: 0     fluorometholone 0.1 % ophthalmic suspension  Commonly known as: FML  Place 1 drop into both eyes 4 times daily.   Quantity: 1 bottle  Refills: 0     folic acid 1 MG tablet  Commonly known as: FOLVITE  1 tablet (1 mg) by Oral route.   Refills: 0     lidocaine-prilocaine 2.5-2.5 % cream  Commonly known as: EMLA  APPLY 1 APPLICATION ON THE SKIN AS DIRECTED APPLY 10 GRAMS TO SKIN 30 MINUTES PRIOR TO PROCEDURE.   Refills: 0     naloxegol oxalate 12.5 MG Tabs tablet  Commonly known as: MOVANTIK  Take 12.5-25 mg by mouth.   Refills: 0     naloxone 4 mg/0.1 mL nasal spray  Commonly known as: NARCAN  1 spray.   Refills: 0     prednisoLONE acetate 1 % ophthalmic suspension  Commonly known as: PRED FORTE  PLACE 1 DROP INTO RIGHT EYE 4 TIMES DAILY. START 3 DAYS PRIOR TO SURGERY   Refills: 0     Rasuvo 7.5 MG/0.15ML Soaj  Inject 0.15 mL under the skin every 7 days.  Generic drug: Methotrexate (PF)   Refills: 0     Synthroid 88 MCG tablet  Take 1 tablet (88 mcg) by mouth.  Generic drug: levothyroxine   Refills: 0     Theratears 0.25 % Soln  Place 1 drop into both eyes  daily.  Generic drug: Carboxymethylcellulose Sodium   Refills: 0     tiotropium 18 MCG inhalation capsule  Commonly known as: [place] by mouth as needed.   Refills: 0     Wixela Inhub 250-50 MCG/DOSE inhaler  Generic drug: fluticasone-salmeterol   Refills: 0        STOP taking these medications    fentaNYL 25 MCG/HR patch  Commonly known as: DURAGESIC     fluticasone propionate 50 MCG/ACT nasal spray  Commonly known as: FLONASE     HYDROcodone-acetaminophen 10-325 MG tablet  Commonly known as: NORCO     methylPREDNISolone sodium succinate 2000 MG injection  Commonly known as: SOLU-MEDROL     naproxen 220 MG tablet  Commonly known as: ALEVE     nitrofurantoin monohydrate 100 MG capsule  Commonly known as: MACROBID     Olopatadine HCl 0.7 % Soln     oxyCODONE 5 MG capsule  Commonly known as: ROXICODONE  Replaced by: oxyCODONE 15 MG immediate release tablet     phenazopyridine 100 MG tablet  Commonly known as: PYRIDIUM     tobramycin 0.3 % ophthalmic solution  Commonly known as: TOBREX     traMADol 50 MG tablet  Commonly known as: ULTRAM     Xiidra 5 % Soln  Generic drug: Lifitegrast           Where to Get Your Medications      Information about where to get these medications is not yet available    Ask your nurse or doctor about these medications   acetaminophen 500 MG tablet   oxyCODONE 15 MG immediate release tablet   senna 8.6 MG tablet   tizanidine 2 MG tablet           Hospital Problem List:  Patient Active Problem List   Diagnosis   . Age-related nuclear cataract of both eyes   . Subcutaneous mass of right thumb   . Cataract, unspecified cataract type, unspecified laterality   . Chronic osteomyelitis of pelvic region, unspecified laterality (CMS-HCC)   . Osteomyelitis (CMS-HCC)             Reason for Admission to the Hospital / History of Present Illness:       Kaiya Boatman is a77 year oldfemale with history of complex pelvic pain issues, including previous surgery for  "nonbacterialosteomyelitis of the pelvis," 3 years ago at [facility] presenting with right hip pain. Patient states at baseline she ambulates with walker but pain has bee worsening for the past 2 weeks. She has been taking oxycodone 67mQ3-4H, alternating with tylenol and CBD without much relief. She has follows outpatient pain management specialist and has tried fentanyl patches which has not controlled her pain. The pain is so severe that she thought about harming self without a plan. Currently patient denies any SI/HI. Denies any fevers, chills, N/V, abdominal pain or dysuria.     Chart review showed:  MRI from 02/20/22 showed"Enhancing lesions of the right iliac bone and left sacral ala, which may be related to a chronic infectious process or a neoplastic disease such as lymphoma. Recommend correlation with prior imaging if available and biopsy if clinically indicated."    ED course:   VSS, afebrile, normotensive. Laboratory work up unremarkable except wbc 13. Urinalysis with large leukocytes, WBC 182.   Psychiatry consulted in  ED. Patient will be admitted to observation for pain control.     Hospital Course by Problem:       #Acute on Chronic right pelvic/hip pain 2/2 chronic osteomyelitis of the pelvis since 2020. Patient followed by [facility]opedic surgery. Patient pending right iliac bone mass biopsy, cryoablation and SI joint CSI scheduled with Dr. Luciana Axe on 03/19/22. While admitted patient was placed on multimodal therapy of tylenol 975 mg Q8H, oxycodone 10/15 mg for mod/severe pain and robaxin. She did receive a one time dose of IV morphine for breakthrough pain. She was able to ambulate without assistance with physical therapy and pain was controlled at 2/10. Plan for patient to discharge home tonight as pain is controlled with outpatient follow up tomorrow with Dr Luciana Axe. May benefit down the line from a bisphosphonate pending biopsy results.   #Enhancing lesions of the right iliac bone and left sacra ala,  concerning for infectious process vs neoplastic disease. As above, has bone biopsy tomorrow with Dr Luciana Axe. Orthopedic surgery consulted, no surgical intervention needed at this time.   #Suicidial ideation, due to worsening plan. Psych consulted in ED. Patient does not meet hold criteria or 1:1 continuous direct observation. Suicidal ideation ceased after pain was controlled.  #Urinary bacteremia, asymptomatic.   #RA, followed by outpatient rheumatologist. Patient with LE rashes, taking prednisone 10 mg daily which was continued.     ---------------------------------------------------------------------------------------------------------------------    Additional Hospital Diagnoses ("rule out" or "suspected" diagnoses, etc.):  None       Consultations Obtained During This Hospitalization:  Psychiatry  Orthopedic Surgery    Principal Procedure During This Hospitalization:  MRI Pelvis Bone W/WO Contrast    Result Date: 02/27/2022  Enhancing lesions of the right iliac bone and left sacral ala, which may be related to a chronic infectious process or a neoplastic disease such as lymphoma. Recommend correlation with prior imaging if available and biopsy if clinically indicated. PET/CT may also provide additional information. I have personally reviewed the images upon which this report is based and agree with the findings and conclusions expressed above.    CT Chest W/O Contrast    Result Date: 02/22/2022  Lucent changes in the T5, T9, T10, and T11 vertebral bodies, nonspecific and may be hemangiomas. Sclerotic changes in the T5 vertebral body, more than expected for degenerative change. Recommend MR thoracic spine without and with intravenous contrast for better evaluation. 6 mm pleural-based right lower lobe nodule. Few bilateral pleural-based and nonpleural-based micronodules. Linear scarring/atelectasis in bilateral lower lobes. Peribronchial wall thickening. Scattered mucous plugging. 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 Small pericardial effusion. Chronic findings, as above.     CT Abdomen And Pelvis W/O Contrast    Result Date: 02/21/2022  1.  Multiple sclerotic bone lesions, concerning for metastatic malignancy. Evaluation for neoplastic process is limited by lack of intravenous contrast. No abnormality suspicious for malignancy identified in the abdomen and pelvis at noncontrast CT. END       Microbiology Cultures:  Microbiology Results (last 7 days)     Procedure Component Value - Date/Time    Coronavirus Disease 2019 (COVID-19) SCOVS [[id number]] Collected: 03/18/22 1905    Lab Status: Final result Specimen: Swab from Nasal-Pharyngeal Updated: 03/18/22 2033     COVID-19 Source SWAB     Comment: NASOPHARYNX        COVID-19 Result NOT DETECTED     COVID-19 Comment Reference range: Not Detected     Comment: An interpretation of Not Detected cannot exclude  the presence of SARS-CoV-2 RNA   concentrations below detection limits. The Xpert Xpress SARS-CoV-2 plus is a   real-time reverse transcription polymerase chain reaction (rRT-PCR) intended   for the qualitative detection of nucleic acid from the SARS-CoV-2.  This test has been authorized by the Food and Drug Administration (FDA) under an Emergency Use Authorization (EUA) for use by laboratories certified to perform moderate and high complexity testing under the Clinical Laboratory Improvement Amendments   (CLIA).                     Hospital Events:  As Above      Tests Outstanding at Discharge Requiring Follow Up:  None    Discharge Condition:  Stable.      Key Physical Exam Findings at Discharge:  No significant physical examination findings at the time of discharge.     03/18/22  2311 03/19/22  0310 03/19/22  1200 03/19/22  1550   BP: (!) 124/48 (!) 124/58 115/90 (!) 124/58   Pulse: 56 66 69 60   Temp: 98.6 F (37 C) 97.1 F (36.2 C) 97.8 F (36.6 C) 97.7 F (36.5 C)   Resp: _0 SpO2: 99% 100% 100% 96%       Lungs:  CTA (B) on room air  Heart:   regular, S1 S2, no murmurs  Abdomen:  soft, non-tender, normo-active bowel sounds  Extremities:  2+ pulses, no edema, tenderness over right hip.     Discharge Diet:   Diet Order Regular       Allergies:  Allergies   Allergen Reactions   . Cefaclor Hives     No problems with amoxicillin/clavulanate - last taken October 2019; Other reaction(s): Pruritic rash (disorder); No problems with amoxicillin/clavulanate - last taken October 2019   . Ciprofloxacin Rash   . Diagnostic X-Ray Materials Rash   . Fd&C Blue #2 [place] Rash   . Gabapentin Nausea and Vomiting   . Hydromorphone Rash     pruritic rash to left arm - no shortness of breath;    Marland Kitchen Ibuprofen Hives and Rash     Aleve;    [place] Kitchen Meloxicam Rash   . Naproxen Rash   . Neomycin-Polymyxin B Gu Rash   . Polyethylene Glycol Hives     Other reaction(s): Pruritic rash (disorder);    [place] Kitchen Pseudoephedrine Rash   . Sulfamethoxazole W-Trimethoprim Hives and Rash     Other reaction(s): Pruritic rash (disorder)   . Guaifenesin Unspecified   . Homeopathic Products Other   . Sulfamethoxazole Other   . Trimethoprim Other   . Iodine Other       MDRO Status: Negative    Discharge Code Status:  Full code / full care  This code status is not changed from the time of admission.    Advance Directive on File?  No    For appointments requested for after discharge that have not yet been scheduled, refer to the Post Discharge Referrals section of the After Visit Summary.    Discharging 42 Contact Information:   Discharging 17 Contact Information: [facility] at [phone]      Thank you for allowing me to partake in your care during your hospital stay.        ATTENDING ATTESTATION:  I saw and evaluated the patient with the APP. I provided a substantive portion of the care of this patient.  I have reviewed and verified this documentation which  accurately reflects our care.  I have independently performed the assessment and plan and documented it within the  note.     >30 minutes of discharge planning and care coordination provided.     Herbie Drape, MD

## 2022-03-19 NOTE — Interdisciplinary (Signed)
Social Work Assessment        Patient Name:  Shelly Neal   MRN: [id number]   Date of Birth: [birth year]    Age: 76 year old   Date of Admission:  03/18/2022         Service Date: March 19, 2022     Assessment  Assessment Type: Initial;Face to Face;Verbal    Referral Information  Referral Type: Mental Health Assessment   Social Assessment  Where was the patient admitted from? *: Home  Mode of Arrival: Car  Prior to Level of Function *: Needed Some Assist with Mobility  Assistive Device *: BS commode;Cane;Rollator/4 Wheel Walker;Walker  Primary Caretaker(s) *: Self  Primary Family/Caregiver Contact Name, Number and Relationship *: Coby, Antrobus [phone]  Permission to Contact *: Yes  Is Patient Minor?: No  Advance Directive Health Care Information Given to Patient: Yes (Not available at time of assessment, pt states it is at home)  Interpreter Used?: Not Needed  Education: Not a Student  Literacy: Can read;Can write     Social Determinants of Health  Living Arrangements on Admission*: Spouse /Significant Other  Post Acute Services Referred To: Other (Comment) (outpatient mental health resources)  Post Acute Services Name, Number, Contact Details: Outpatient mental health psychiatrist and therapist resources  Post Acute Resources Provided: [place]  A List of Community Resources and/or Shelters Provided: Not Applicable  Available Assistance/Support System *: Children;Spouse / significant other  Type of Residence *: [place] *: No  Do You Have Transportation Issues/Concerns That Make It Difficult To Get To Your Appointments? : No  Has discharge transport been arranged?: No  Discharge Transportation Details * : [place], [place]  Transportation Company/Phone Number * : [phone]  Transportation* : Car  Patient Engaged in Discharge Planning *: No  Family/Caregiver's Assessed for *: Not Applicable  Respite Care *: Not Applicable  Patient/Family/Other Are In  Agreement With Discharge Plan *: To be determined  Primary Care Access: Assigned PCP  Medication Compliance: Misses medication  Involvement with Law: None    Income Information  Income Source: Warden/ranger Resources: Investment banker, corporate History: Not Applicable  Veterans Affiliation: No  Do you have difficulty affording your medications: No    Mental Health Assessment  Past Mental Health Issues: History of depression and anxiety  Mental Status: Sadness/Depressed;Anxiety;Mood swings;Hopelessness  Behavioral Assessment: Worrying  Physical Assessment: Lack of energy  Mental Status - Orientation: A&O X 4  Have you experienced any neglect or abuse in the past?: Unable to assess at this time (document barriers) (Patient declined to disclose)  Past Neglect or Abuse re-assessed at discharged: Unable to assess at this time (document barriers) (Patient declined to disclose)  Notify Treatment Team to Assess Patient's Capacity?: No concerns at this time    Substance Abuse History (CAGE-AID)  Have you ever felt you ought to cut down on your drinking or drug use?: No  Have people annoyed you by criticizing your drinking or drug use?: No  Have you ever felt bad or guilty about your drinking or drug use?: No  Have you ever had a drink or used drugs first thing in the morning to steady your nerves or to get rid of a hangover?: No  Number of "Yes" Responses: 0  Substance Abuse History: Denies any history of substance abuse    Referral To  Community Resources: Health education    Readmission Risk Assessment  Do You Have Transportation Issues/Concerns  That Make It Difficult To Get To Your Appointments? : No    Plans/Interventions/Discharge  Plan/Interventions: Explore needs and options for aftercare, provide referrals  Anticipated Discharge Destination: Home  Discharge Resources Given: Outpatient community psychiatrist and therapist referrals  Barriers to Discharge *: DC order delay;Clinical reason  Do you have  difficulty affording your medications: No    Alcohol Use  Q1: How often do you have a drink containing alcohol?: Never  AUDIT-C Total Score: 0    CSW met with patient at bedside to address psychiatrist consult for positive SI.     The patient is a 76 year old female with no previous psychiatric diagnosis who presents to ED brought in by husband voluntarily for right hip pain, incidentally expressed suicidal ideation secondary to hip pain. No UDS done.    CSW spoke with patient who advises they live with their husband in a private residence.  The patient states she has children but they live out of state and she does not like to bother them.  The patient denies any history of substance abuse. The patient endorses a mental health history of anxiety, not diagnosed, not medicated.   The patient states her mother killed herself when the patient was in her 62's and that was a traumatic experience that weighed heavily on her. The patient denies ever reaching out for any psychiatrist or therapist support. The patient is open to the option of outpatient support and accepted referrals for psychiatry and therapy- delivered to bedside.     The patient denies SI, HI, AVH currently stating her suicidal ideation is solely contingent upon her pain level.  The patient states she is not always suicidal, but when the pain gets very bad she has a thought that "I can do it."  Resources provided for outpatient care, no further needs assessed at this time.     Ozzie Hoyle, LCSW     Date: 03/19/2022    Time: 10:17 AM    P [phone]

## 2022-03-19 NOTE — ED Notes (Signed)
Pt requested a 7Up from the cafeteria. The cafeteria only had Ginger ale drink. Pt was okay with drink. Provide Pt with breakfast tray and ginger ale, Pt tolerated well.

## 2022-03-19 NOTE — Interdisciplinary (Signed)
Physical Therapy Evaluation and Discharge    Post Acute Discharge Recommendations  Discharge Rehabilitation Recommendations: None- patient currently  has no further skilled therapy needs  Equipment recommendations: No equipment needed - patient has own equipment    Admitting Physician:  Herbie Drape, MD  Admission Date 03/18/2022    Inpatient Diagnosis:   Problem List       Codes    Intractable pain    -  Primary ICD-10-CM: R52  ICD-9-CM: 780.96    Gait instability     ICD-10-CM: R26.81  ICD-9-CM: 781.2    Decreased activities of daily living (ADL)     ICD-10-CM: Z78.9  ICD-9-CM: V49.89    Impaired functional mobility, balance, gait, and endurance     ICD-10-CM: Z74.09  ICD-9-CM: V49.89          IP Start of Service   Start of Care: 03/19/22  Onset Date: 03/18/2022  Reason for referral: Activity tolerance limitation;Decline in functional ability/mobility;Range of motion/strength limitations;Safety/judgement impairment    Preferred Language:English         Past Medical History:   Diagnosis Date   . Cataract    . Chronic osteomyelitis, pelvic region and thigh (CMS-HCC)    . Dry eye    . Hypothyroidism    . RA (rheumatoid arthritis) (CMS-HCC)       Past Surgical History:   Procedure Laterality Date   . CATARACT EXTRACTION Right 09/02/2020   . BONE RESECTION Right 2019    [place] Name 03/19/22 1500          Type of Visit    Type of Physical Therapy note Physical Therapy Evaluation and Discharge     [place] Name 03/19/22 1500          Treatment Precautions/Restrictions    Precautions/Restrictions Fall;Multiple lines     Fall Socks/charm     Other Precautions/Restrictions Information PIV     [place] Name 03/19/22 1500          Medical History    History of presenting condition Per chart: Shelly Neal is a 76 year old female who presents to [facility] for evaluation of 4 months of increasing right sided pelvic pain.  At the time of interview, patient states that she  Was scheduled to have a biopsy and CSI with IR  on Friday however anesthesia is requiring further workup of pulmonary nodules before she may undergo with the proceedure. In the meantime the pain has become worse, limiting her ability to get around and take care of herself. Patient denies any numbness or tingling in her arms or legs.     Fall history No falls reported in the last 6 months     [place] Name 03/19/22 1500          Functional History    Prior Level of Function Minimal deficits     Equipment required for mobility in the home None     Other Functional History Information Has a [facility] and [place] Name 03/19/22 1500          Social History    Living Situation Lives with spouse/partner;Has caregiver/attendant/assistance available     [place];Other (Comment)  [place] accessibility  Performs activities of daily living (ADL's) on one level;Stairs present     Number of steps to enter home 0     Number of  steps within home 24     Other Social History Information Patient has a chair lift to navigate through the stairs     [place] Name 03/19/22 1500          Subjective    Subjective Information Pt. received in supine and agreeable to physical therapy.     Patient status Patient agreeable to treatment;Nursing in agreement for treatment     [place] Name 03/19/22 1500          Pain Assessment    Pain Asssessment Tool Numeric Pain Rating Scale     Row Name 03/19/22 1500          Numeric Pain Rating Scale    Pain Intensity - rating at present 2     Pain Intensity- rating after treatment 2     Location R hip     Row Name 03/19/22 1500          Objective    Overall Cognitive Status Intact - no cognitive limitations or impairments noted     Communication No communication limitations or impairments noted. Current status of hearing, speech and vision allow functional communication.     Coordination/Motor control No limitations or impairments noted. Movement patterns are fluid and coordinated throughout     Balance Balance limitations present     Static Sitting  Balance Normal - able to maintain steady balance without handhold support     Dynamic Sitting Balance Good - accepts moderate challenge, able to maintain balance while picking object off floor     Static Standing Balance Normal - able to maintain steady balance without handhold support     Dynamic Standing Balance Good - accepts moderate challenge, able to maintain balance while picking object off floor     Extremity Assessment Flexibility, strength, muscle tone and sensation grossly within functional limits throughout     Other  Extremity Assessment  Information Sensation grossly intact     Functional Mobility Functional mobility deficits present     Bed Mobility Supervised     Bed Mobility Comments supine <> sit; HOB flat     Transfers to/from Stand Supervised     Transfer Comments sit <> stand without AD     Gait Supervised     Gait Comments Demo slight wide BOS, adequate stride length and cadence, cues for safety, steady throughout     Device used for ambulation/mobility None     Ambulation Distance 100 feet     Other Objective Findings No reports of dizziness or dyspnea                      Eval cont.     [place] Name 03/19/22 1500          [place] AM-PAC: Basic Mobility    Assistance Needed to Turn from Back to Side While in a Flat Bed Without Using Bedrails 3 - A little (supervised/min assist)     Difficulty with Supine to Sit Transfer 3 - A little (supervised/min assist)     How Much Help Needed to Move to/from Bed to Chair 3 - A little (supervised/min assist)     Difficulty with Sit to Stand Transfer from Chair with Arms 3 - A little (supervised/min assist)     How Much Help Needed to Walk in Room 3 - A little (supervised/min assist)     How Much Help Needed to Climb 3-5 Steps with a Rail 3 - A little (supervised/min assist)  AMPAC Total Score 18     Assessment: AM-PAC Basic Mobility Impairment Rating Score 13-18 - 40-59% impaired     Row Name 03/19/22 1500          Patient/Family Education    Learner(s)  Patient     Learner response to rehab patient education interventions Verbalizes understanding;Able to return demonstrate teaching     Patient/family training comments POC, safety, progressive mobility     Row Name 03/19/22 1500          Assessment    Assessment Pt. presents to physical therapy today with good strength, flexibility, balance, and activity tolerance. Prior to admission, pt. had minimal deficits without using any AD. Overall supervision; steady throughout. At this time, pt. is at functional baseline level and will not require PT services. Pt. will be discharged from physical therapy.     Rehab Potential Excellent     Row Name 03/19/22 1500          Patient stated Goal    Patient stated goal To get better     Row Name 03/19/22 1500          Treatment Plan Disussion    Treatment Plan Discussion and Agreement Patient/family/caregiver stated understanding and agreement with the therapy plan     [place] Name 03/19/22 1500          Treatment Plan    Frequency of treatment Patient appropriate for discharge from therapy     Duration of treatment (number of visits) One time only, further treatment not indicated     Status of treatment Patient appropriate for discharge from therapy     [place] Name 03/19/22 1500          Patient Safety Considerations    Patient safety considerations Patient returned to bed at end of treatment;Call light left in reach and fall precautions in place;Nursing notified of safety considerations at end of treatment     Patient assistive device requirements for safe ambulation No device required     Other Patient Safety Considerations Information Notified RN Johnnette Litter     [place] Name 03/19/22 1500          Therapy Plan Communication    Therapy Plan Communication Discussed therapy plan with Nursing and/or Physician;Encouraged out of bed with assistance by     Encouraged out of bed with assistance by Nursing;Staff     Row Name 03/19/22 1500          Physical Therapy Patient Discharge Instructions     Your Physical Therapist suggests the following Continue to follow your prescribed mobility precautions when moving in and out of bed and walking  as instructed;Continue to use correct body mechanics when moving in and out of bed as instructed;Supervision with walking is suggested for increased safety     [place] Name 03/19/22 1500          Discharge Report    Discharge Report Date 03/19/22     Reason for discharge Other (comments)  At functional baseline level     Patient participation Excellent, patient participated in all treatment sessions     Patient compliance with therapy program Excellent     Response to therapy Excellent     [place] Name 03/19/22 1500          Type of Eval    Moderate Complexity (97162) Completed     [place] Name 03/19/22 1500          Therapeutic Procedures    Gait  Training [phone]) Dynamic activities while walking;Gait pattern analysis and treatment of deviations;Patient education;Postural alighnment/biomechanic training during gait        Total TIMED Treatment (min)  15     Row Name 03/19/22 1500          Treatment Time     Total TIMED Treatment  (min) 30     Total Treatment Time (min) 60     Treatment start time 1410                   The physical therapist of record is endorsed by evaluating physical therapist.

## 2022-03-19 NOTE — ED Notes (Signed)
Bed: EDA-06  Expected date: 03/19/22  Expected time:   Means of arrival:   Comments:  21

## 2022-03-19 NOTE — ED Notes (Signed)
Pt continues to rest comfortably with eyes closed. Respiration equal and unlabored. Will continued to monitor Pt while Pt is asleep.

## 2022-03-19 NOTE — ED Notes (Signed)
Observed Pt resting comfortably with eye closed, open when spoken too. Per night RN report, Pt was recently medicated for pain and hives. Observed respiration equal and unlabored. Will continue to monitor Pt closely.

## 2022-03-20 ENCOUNTER — Ambulatory Visit: Payer: Medicare Other

## 2022-03-20 ENCOUNTER — Ambulatory Visit (HOSPITAL_BASED_OUTPATIENT_CLINIC_OR_DEPARTMENT_OTHER): Payer: Medicare Other

## 2022-03-20 ENCOUNTER — Encounter: Admission: RE | Disposition: A | Discharge status: Home Health | Payer: Self-pay | Attending: Hospitalist

## 2022-03-20 ENCOUNTER — Observation Stay
Admission: RE | Admit: 2022-03-20 | Discharge: 2022-03-21 | Disposition: A | Discharge status: Home / Self Care | Payer: Medicare Other | Attending: Hospitalist | Admitting: Hospitalist

## 2022-03-20 ENCOUNTER — Encounter: Payer: Self-pay | Admitting: Interventional Radiology

## 2022-03-20 DIAGNOSIS — Z886 Allergy status to analgesic agent status: Secondary | ICD-10-CM | POA: Insufficient documentation

## 2022-03-20 DIAGNOSIS — Z79631 Long term (current) use of antimetabolite agent: Secondary | ICD-10-CM | POA: Insufficient documentation

## 2022-03-20 DIAGNOSIS — Z885 Allergy status to narcotic agent status: Secondary | ICD-10-CM | POA: Insufficient documentation

## 2022-03-20 DIAGNOSIS — Z888 Allergy status to other drugs, medicaments and biological substances status: Secondary | ICD-10-CM | POA: Insufficient documentation

## 2022-03-20 DIAGNOSIS — M898X9 Other specified disorders of bone, unspecified site: Principal | ICD-10-CM | POA: Insufficient documentation

## 2022-03-20 DIAGNOSIS — Z7952 Long term (current) use of systemic steroids: Secondary | ICD-10-CM | POA: Insufficient documentation

## 2022-03-20 DIAGNOSIS — Z881 Allergy status to other antibiotic agents status: Secondary | ICD-10-CM | POA: Insufficient documentation

## 2022-03-20 DIAGNOSIS — R21 Rash and other nonspecific skin eruption: Secondary | ICD-10-CM | POA: Insufficient documentation

## 2022-03-20 DIAGNOSIS — M8669 Other chronic osteomyelitis, multiple sites: Secondary | ICD-10-CM | POA: Insufficient documentation

## 2022-03-20 DIAGNOSIS — E039 Hypothyroidism, unspecified: Secondary | ICD-10-CM | POA: Insufficient documentation

## 2022-03-20 DIAGNOSIS — Z79899 Other long term (current) drug therapy: Secondary | ICD-10-CM | POA: Insufficient documentation

## 2022-03-20 DIAGNOSIS — M069 Rheumatoid arthritis, unspecified: Secondary | ICD-10-CM | POA: Insufficient documentation

## 2022-03-20 DIAGNOSIS — G8918 Other acute postprocedural pain: Secondary | ICD-10-CM | POA: Insufficient documentation

## 2022-03-20 DIAGNOSIS — M533 Sacrococcygeal disorders, not elsewhere classified: Secondary | ICD-10-CM

## 2022-03-20 DIAGNOSIS — R52 Pain, unspecified: Secondary | ICD-10-CM | POA: Diagnosis present

## 2022-03-20 DIAGNOSIS — Z882 Allergy status to sulfonamides status: Secondary | ICD-10-CM | POA: Insufficient documentation

## 2022-03-20 DIAGNOSIS — M898X8 Other specified disorders of bone, other site: Secondary | ICD-10-CM

## 2022-03-20 DIAGNOSIS — G8929 Other chronic pain: Secondary | ICD-10-CM | POA: Insufficient documentation

## 2022-03-20 DIAGNOSIS — D72820 Lymphocytosis (symptomatic): Secondary | ICD-10-CM

## 2022-03-20 DIAGNOSIS — J45909 Unspecified asthma, uncomplicated: Secondary | ICD-10-CM | POA: Insufficient documentation

## 2022-03-20 DIAGNOSIS — Z8579 Personal history of other malignant neoplasms of lymphoid, hematopoietic and related tissues: Secondary | ICD-10-CM | POA: Insufficient documentation

## 2022-03-20 DIAGNOSIS — H04129 Dry eye syndrome of unspecified lacrimal gland: Secondary | ICD-10-CM | POA: Insufficient documentation

## 2022-03-20 DIAGNOSIS — Z79890 Hormone replacement therapy (postmenopausal): Secondary | ICD-10-CM | POA: Insufficient documentation

## 2022-03-20 DIAGNOSIS — F419 Anxiety disorder, unspecified: Secondary | ICD-10-CM | POA: Insufficient documentation

## 2022-03-20 DIAGNOSIS — G4733 Obstructive sleep apnea (adult) (pediatric): Secondary | ICD-10-CM | POA: Insufficient documentation

## 2022-03-20 SURGERY — IR RADIOLOGY PROCEDURE NOS
Anesthesia: General | Wound class: Class I (Clean)

## 2022-03-20 MED ORDER — LEVOTHYROXINE SODIUM 88 MCG OR TABS
88.0000 ug | ORAL_TABLET | Freq: Every day | ORAL | Status: DC
Start: 2022-03-21 — End: 2022-03-21
  Administered 2022-03-21: 88 ug via ORAL
  Filled 2022-03-20 (×2): qty 1

## 2022-03-20 MED ORDER — ONDANSETRON HCL 4 MG/2ML IV SOLN
4.0000 mg | Freq: Once | INTRAMUSCULAR | Status: AC | PRN
Start: 2022-03-20 — End: 2022-03-21
  Administered 2022-03-21: 4 mg via INTRAVENOUS
  Filled 2022-03-20: qty 2

## 2022-03-20 MED ORDER — ACETAMINOPHEN 325 MG PO TABS
975.0000 mg | ORAL_TABLET | Freq: Three times a day (TID) | ORAL | Status: DC
Start: 2022-03-20 — End: 2022-03-20

## 2022-03-20 MED ORDER — PROPOFOL 200 MG/20ML IV EMUL
INTRAVENOUS | Status: DC | PRN
Start: 2022-03-20 — End: 2022-03-20
  Administered 2022-03-20: 120 mg via INTRAVENOUS

## 2022-03-20 MED ORDER — HEPARIN SODIUM (PORCINE) 5000 UNIT/ML IJ SOLN
5000.0000 [IU] | Freq: Two times a day (BID) | INTRAMUSCULAR | Status: DC
Start: 2022-03-20 — End: 2022-03-21
  Administered 2022-03-20 – 2022-03-21 (×2): 5000 [IU] via SUBCUTANEOUS
  Filled 2022-03-20 (×3): qty 1

## 2022-03-20 MED ORDER — FENTANYL CITRATE (PF) 100 MCG/2ML IJ SOLN
INTRAMUSCULAR | Status: DC | PRN
Start: 2022-03-20 — End: 2022-03-20
  Administered 2022-03-20 (×2): 50 ug via INTRAVENOUS

## 2022-03-20 MED ORDER — ONDANSETRON HCL 4 MG/2ML IV SOLN
INTRAMUSCULAR | Status: DC | PRN
Start: 2022-03-20 — End: 2022-03-20
  Administered 2022-03-20: 4 mg via INTRAVENOUS

## 2022-03-20 MED ORDER — PREDNISONE 10 MG OR TABS
10.0000 mg | ORAL_TABLET | Freq: Every day | ORAL | Status: DC
Start: 2022-03-21 — End: 2022-03-21
  Administered 2022-03-21: 10 mg via ORAL
  Filled 2022-03-20 (×2): qty 1

## 2022-03-20 MED ORDER — OXYCODONE HCL 5 MG OR TABS
10.0000 mg | ORAL_TABLET | ORAL | Status: DC | PRN
Start: 2022-03-20 — End: 2022-03-21
  Administered 2022-03-20: 10 mg via ORAL
  Filled 2022-03-20: qty 2

## 2022-03-20 MED ORDER — FENTANYL CITRATE (PF) 100 MCG/2ML IJ SOLN
INTRAMUSCULAR | Status: AC
Start: 2022-03-20 — End: ?
  Filled 2022-03-20: qty 2

## 2022-03-20 MED ORDER — ROCURONIUM BROMIDE 100 MG/10ML IV SOLN
INTRAVENOUS | Status: DC | PRN
Start: 2022-03-20 — End: 2022-03-20
  Administered 2022-03-20: 20 mg via INTRAVENOUS
  Administered 2022-03-20: 10 mg via INTRAVENOUS
  Administered 2022-03-20: 20 mg via INTRAVENOUS
  Administered 2022-03-20: 50 mg via INTRAVENOUS

## 2022-03-20 MED ORDER — DEXAMETHASONE SODIUM PHOSPHATE 4 MG/ML IJ SOLN (CUSTOM)
INTRAMUSCULAR | Status: DC | PRN
Start: 2022-03-20 — End: 2022-03-20
  Administered 2022-03-20: 4 mg via INTRAVENOUS

## 2022-03-20 MED ORDER — FENTANYL CITRATE (PF) 100 MCG/2ML IJ SOLN
50.0000 ug | INTRAMUSCULAR | Status: DC | PRN
Start: 2022-03-20 — End: 2022-03-20
  Administered 2022-03-20 (×2): 50 ug via INTRAVENOUS

## 2022-03-20 MED ORDER — SERTRALINE HCL 100 MG OR TABS
100.0000 mg | ORAL_TABLET | Freq: Every day | ORAL | Status: DC
Start: 2022-03-21 — End: 2022-03-21
  Administered 2022-03-21: 100 mg via ORAL
  Filled 2022-03-20 (×2): qty 1

## 2022-03-20 MED ORDER — SUGAMMADEX SODIUM 200 MG/2ML IV SOLN
INTRAVENOUS | Status: DC | PRN
Start: 2022-03-20 — End: 2022-03-20
  Administered 2022-03-20: 200 mg via INTRAVENOUS

## 2022-03-20 MED ORDER — ROCURONIUM BROMIDE 50 MG/5ML IV SOLN
INTRAVENOUS | Status: AC
Start: 2022-03-20 — End: ?
  Filled 2022-03-20: qty 5

## 2022-03-20 MED ORDER — MEPERIDINE HCL 50 MG/ML IJ SOLN
12.5000 mg | INTRAMUSCULAR | Status: DC | PRN
Start: 2022-03-20 — End: 2022-03-20

## 2022-03-20 MED ORDER — PHENYLEPHRINE DILUTION 100 MCG/ML IJ SOLN
INTRAVENOUS | Status: DC | PRN
Start: 2022-03-20 — End: 2022-03-20
  Administered 2022-03-20: 100 ug via INTRAVENOUS
  Administered 2022-03-20: 150 ug via INTRAVENOUS
  Administered 2022-03-20: 100 ug via INTRAVENOUS

## 2022-03-20 MED ORDER — LIDOCAINE HCL 1 % IJ SOLN
INTRAMUSCULAR | Status: AC
Start: 2022-03-20 — End: ?
  Filled 2022-03-20: qty 20

## 2022-03-20 MED ORDER — MORPHINE SULFATE 4 MG/ML IJ SOLN
4.0000 mg | Freq: Four times a day (QID) | INTRAMUSCULAR | Status: DC | PRN
Start: 2022-03-20 — End: 2022-03-21
  Administered 2022-03-20: 4 mg via INTRAVENOUS
  Filled 2022-03-20: qty 1

## 2022-03-20 MED ORDER — IPRATROPIUM-ALBUTEROL 0.5-2.5 (3) MG/3ML IN SOLN
RESPIRATORY_TRACT | Status: AC
Start: 2022-03-20 — End: ?
  Filled 2022-03-20: qty 1

## 2022-03-20 MED ORDER — DIPHENHYDRAMINE HCL 50 MG/ML IJ SOLN
INTRAMUSCULAR | Status: DC | PRN
Start: 2022-03-20 — End: 2022-03-20
  Administered 2022-03-20: 12.5 mg via INTRAVENOUS

## 2022-03-20 MED ORDER — ACETAMINOPHEN 325 MG PO TABS
975.0000 mg | ORAL_TABLET | Freq: Three times a day (TID) | ORAL | Status: DC
Start: 2022-03-20 — End: 2022-03-21
  Administered 2022-03-20 – 2022-03-21 (×3): 975 mg via ORAL
  Filled 2022-03-20 (×3): qty 3

## 2022-03-20 MED ORDER — OXYCODONE HCL 5 MG OR TABS
10.0000 mg | ORAL_TABLET | Freq: Once | ORAL | Status: AC
Start: 2022-03-20 — End: 2022-03-20
  Administered 2022-03-20: 10 mg via ORAL

## 2022-03-20 MED ORDER — LIDOCAINE HCL 1 % IJ SOLN
INTRAMUSCULAR | Status: DC | PRN
Start: 2022-03-20 — End: 2022-03-20
  Administered 2022-03-20: 7 mL via INTRADERMAL

## 2022-03-20 MED ORDER — ACETAMINOPHEN 650 MG RE SUPP
650.0000 mg | RECTAL | Status: DC | PRN
Start: 2022-03-20 — End: 2022-03-20

## 2022-03-20 MED ORDER — SUGAMMADEX SODIUM 200 MG/2ML IV SOLN
INTRAVENOUS | Status: AC
Start: 2022-03-20 — End: ?
  Filled 2022-03-20: qty 2

## 2022-03-20 MED ORDER — SODIUM CHLORIDE 0.9 % IV SOLN
600.0000 mg | Freq: Once | INTRAVENOUS | Status: DC
Start: 2022-03-20 — End: 2022-03-20
  Filled 2022-03-20 (×3): qty 4

## 2022-03-20 MED ORDER — ROPIVACAINE HCL 5 MG/ML IJ SOLN
INTRAMUSCULAR | Status: AC
Start: 2022-03-20 — End: ?
  Filled 2022-03-20: qty 30

## 2022-03-20 MED ORDER — OXYCODONE HCL 5 MG OR TABS
ORAL_TABLET | ORAL | Status: AC
Start: 2022-03-20 — End: ?
  Filled 2022-03-20: qty 2

## 2022-03-20 MED ORDER — OXYCODONE HCL 5 MG OR TABS
15.0000 mg | ORAL_TABLET | ORAL | Status: DC | PRN
Start: 2022-03-20 — End: 2022-03-21
  Administered 2022-03-21 (×2): 15 mg via ORAL
  Filled 2022-03-20 (×2): qty 3

## 2022-03-20 MED ORDER — PROPOFOL 1000 MG/100 ML INFUSION (~~LOC~~)
INTRAVENOUS | Status: DC | PRN
Start: 2022-03-20 — End: 2022-03-20
  Administered 2022-03-20: 165 ug/kg/min via INTRAVENOUS
  Administered 2022-03-20 (×2): 150 ug/kg/min via INTRAVENOUS
  Administered 2022-03-20: 125 ug/kg/min via INTRAVENOUS
  Administered 2022-03-20 (×2): 50 ug/kg/min via INTRAVENOUS
  Administered 2022-03-20: 75 ug/kg/min via INTRAVENOUS
  Administered 2022-03-20: 100 ug/kg/min via INTRAVENOUS

## 2022-03-20 MED ORDER — ACETAMINOPHEN 10 MG/ML IV SOLN
1000.0000 mg | Freq: Once | INTRAVENOUS | Status: AC
Start: 2022-03-20 — End: 2022-03-20
  Administered 2022-03-20: 1000 mg via INTRAVENOUS

## 2022-03-20 MED ORDER — FOLIC ACID 1 MG OR TABS
1.0000 mg | ORAL_TABLET | Freq: Every day | ORAL | Status: DC
Start: 2022-03-21 — End: 2022-03-21
  Administered 2022-03-21: 1 mg via ORAL
  Filled 2022-03-20 (×2): qty 1

## 2022-03-20 MED ORDER — TRIAMCINOLONE ACETONIDE 40 MG/ML IJ SUSP
40.0000 mg | Freq: Once | INTRAMUSCULAR | Status: AC
Start: 2022-03-20 — End: 2022-03-20
  Administered 2022-03-20: 40 mg via INTRASYNOVIAL
  Filled 2022-03-20 (×3): qty 1

## 2022-03-20 MED ORDER — HYDROCORTISONE SOD SUCCINATE 100 MG IJ SOLR (CUSTOM)
INTRAMUSCULAR | Status: DC | PRN
Start: 2022-03-20 — End: 2022-03-20
  Administered 2022-03-20: 100 mg via INTRAVENOUS

## 2022-03-20 MED ORDER — PROPOFOL 200 MG/20ML IV EMUL
INTRAVENOUS | Status: AC
Start: 2022-03-20 — End: ?
  Filled 2022-03-20: qty 20

## 2022-03-20 MED ORDER — SENNA 8.6 MG OR TABS
8.6000 mg | ORAL_TABLET | Freq: Every evening | ORAL | Status: DC
Start: 2022-03-20 — End: 2022-03-21
  Administered 2022-03-20: 8.6 mg via ORAL
  Filled 2022-03-20 (×2): qty 1

## 2022-03-20 MED ORDER — ONDANSETRON HCL 4 MG OR TABS
4.0000 mg | ORAL_TABLET | Freq: Four times a day (QID) | ORAL | Status: DC | PRN
Start: 2022-03-20 — End: 2022-03-20
  Filled 2022-03-20 (×2): qty 1

## 2022-03-20 MED ORDER — CLINDAMYCIN PHOSPHATE IN NACL 600-0.9 MG/50ML-% IV SOLN
600.0000 mg | Freq: Once | INTRAVENOUS | Status: AC
Start: 2022-03-20 — End: 2022-03-20
  Administered 2022-03-20: 600 mg via INTRAVENOUS
  Filled 2022-03-20: qty 50

## 2022-03-20 MED ORDER — PLASMA-LYTE A IV SOLN
INTRAVENOUS | Status: DC | PRN
Start: 2022-03-20 — End: 2022-03-20

## 2022-03-20 MED ORDER — ACETAMINOPHEN 325 MG PO TABS
650.0000 mg | ORAL_TABLET | ORAL | Status: DC | PRN
Start: 2022-03-20 — End: 2022-03-20

## 2022-03-20 MED ORDER — ACETAMINOPHEN 10 MG/ML IV SOLN
INTRAVENOUS | Status: AC
Start: 2022-03-20 — End: ?
  Filled 2022-03-20: qty 100

## 2022-03-20 MED ORDER — HYDROCORTISONE SOD SUCCINATE 100 MG IJ SOLR (CUSTOM)
INTRAMUSCULAR | Status: AC
Start: 2022-03-20 — End: ?
  Filled 2022-03-20: qty 2

## 2022-03-20 MED ORDER — SODIUM CHLORIDE 0.9 % IV SOLN
INTRAVENOUS | Status: DC | PRN
Start: 2022-03-20 — End: 2022-03-20
  Administered 2022-03-20: 5 ug/min via INTRAVENOUS
  Administered 2022-03-20: 10 ug/min via INTRAVENOUS
  Administered 2022-03-20: 20 ug/min via INTRAVENOUS

## 2022-03-20 MED ORDER — GLYCOPYRROLATE 1 MG/5ML IJ SOLN
INTRAMUSCULAR | Status: DC | PRN
Start: 2022-03-20 — End: 2022-03-20
  Administered 2022-03-20: .2 mg via INTRAVENOUS

## 2022-03-20 MED ORDER — DIPHENHYDRAMINE HCL 25 MG OR TABS OR CAPS
25.0000 mg | ORAL_CAPSULE | ORAL | Status: DC | PRN
Start: 2022-03-20 — End: 2022-03-21
  Administered 2022-03-21: 25 mg via ORAL
  Filled 2022-03-20: qty 1

## 2022-03-20 MED ORDER — EPHEDRINE SULFATE (PRESSORS) 5 MG/ML IV SOLN
INTRAVENOUS | Status: AC
Start: 2022-03-20 — End: ?
  Filled 2022-03-20: qty 10

## 2022-03-20 MED ORDER — FENTANYL CITRATE (PF) 100 MCG/2ML IJ SOLN
25.0000 ug | INTRAMUSCULAR | Status: DC | PRN
Start: 2022-03-20 — End: 2022-03-20

## 2022-03-20 MED ORDER — METHOCARBAMOL 750 MG OR TABS
750.0000 mg | ORAL_TABLET | Freq: Four times a day (QID) | ORAL | Status: DC
Start: 2022-03-20 — End: 2022-03-21
  Administered 2022-03-20 – 2022-03-21 (×2): 750 mg via ORAL
  Filled 2022-03-20 (×3): qty 1

## 2022-03-20 MED ORDER — SODIUM CHLORIDE 0.9 % IV SOLN
INTRAVENOUS | Status: DC
Start: 2022-03-20 — End: 2022-03-20

## 2022-03-20 MED ORDER — ALUM & MAG HYDROXIDE-SIMETH 400-400-40 MG/5ML OR SUSP
15.0000 mL | Freq: Four times a day (QID) | ORAL | Status: DC | PRN
Start: 2022-03-20 — End: 2022-03-21
  Filled 2022-03-20 (×3): qty 15

## 2022-03-20 MED ORDER — IPRATROPIUM-ALBUTEROL 0.5-2.5 (3) MG/3ML IN SOLN
3.0000 mL | Freq: Once | RESPIRATORY_TRACT | Status: AC
Start: 2022-03-20 — End: 2022-03-20
  Administered 2022-03-20: 3 mL via RESPIRATORY_TRACT

## 2022-03-20 MED ORDER — ROPIVACAINE HCL 5 MG/ML IJ SOLN
INTRAMUSCULAR | Status: DC | PRN
Start: 2022-03-20 — End: 2022-03-20
  Administered 2022-03-20: 10 mL
  Administered 2022-03-20 (×2): 2 mL

## 2022-03-20 MED ORDER — METHOCARBAMOL 500 MG OR TABS
500.0000 mg | ORAL_TABLET | Freq: Four times a day (QID) | ORAL | Status: DC
Start: 2022-03-20 — End: 2022-03-20

## 2022-03-20 MED ORDER — CETIRIZINE HCL 5 MG OR TABS
10.0000 mg | ORAL_TABLET | Freq: Every day | ORAL | Status: DC
Start: 2022-03-21 — End: 2022-03-21
  Administered 2022-03-21: 10 mg via ORAL
  Filled 2022-03-20 (×2): qty 2

## 2022-03-20 MED ORDER — NALOXONE HCL 0.4 MG/ML IJ SOLN
0.2000 mg | Freq: Once | INTRAMUSCULAR | Status: DC | PRN
Start: 2022-03-20 — End: 2022-03-21

## 2022-03-20 MED ORDER — PROPOFOL 1000 MG/100ML IV EMUL
INTRAVENOUS | Status: AC
Start: 2022-03-20 — End: ?
  Filled 2022-03-20: qty 200

## 2022-03-20 MED ORDER — ONDANSETRON HCL 4 MG/2ML IV SOLN
4.0000 mg | Freq: Four times a day (QID) | INTRAMUSCULAR | Status: DC | PRN
Start: 2022-03-20 — End: 2022-03-20

## 2022-03-20 MED ORDER — CLINDAMYCIN PHOSPHATE IN NACL 900-0.9 MG/50ML-% IV SOLN
900.0000 mg | Freq: Once | INTRAVENOUS | Status: DC
Start: 2022-03-20 — End: 2022-03-20

## 2022-03-20 MED ORDER — LIDOCAINE 1% SOLN OPTIME (~~LOC~~)
INTRAMUSCULAR | Status: DC | PRN
Start: 2022-03-20 — End: 2022-03-20
  Administered 2022-03-20: 40 mg via INTRAVENOUS

## 2022-03-20 SURGICAL SUPPLY — 14 items
DRAPE, MINOR, PROC, 6X6 FEN, STER (Drapes/towels) ×2 IMPLANT
DRESSING - SMALL TEGADERM 2 X 2 (Dressings/packing) ×4 IMPLANT
GEL DOPPLER 20 ML STER AQUASONIC (Misc Medical Supply) ×2 IMPLANT
GLOVES BIOGEL PI 6.0 LF PF NON-LATEX (Gloves/Gowns) ×2 IMPLANT
GLOVES SUPERSEN 7.5 LTX PF (Gloves/Gowns) ×2 IMPLANT
GOWN SURGICAL XL NON-REINFORCED RAGLAN SLEEVE AURORA STERILE (Gloves/Gowns) ×4 IMPLANT
NEEDLE - CHIBA 22G X 10CM (Needles/punch/cannula/biopsy) ×2 IMPLANT
PACK - CT BX (Procedure Packs/kits) ×2 IMPLANT
STERILE WATER IRRIG 1000 ML BTL ×2 IMPLANT
Sponge Gauze 2x2in 4-Ply Non-woven 2pk Sterile (Dressings/packing) ×2 IMPLANT
Syringe 10ml Luer-Lok Tip Disp (Misc Medical Supply) ×4 IMPLANT
Tray Biopsy Bone Lesion Coaxial 10-12g 102-147mm OnControl (Kits/Sets/Trays) ×4 IMPLANT
iceforce 2.1 cx 90 degrees (Needles/punch/cannula/biopsy) ×4 IMPLANT
single basin set -LF (Misc Surgical Supply) ×2 IMPLANT

## 2022-03-20 SURGICAL SUPPLY — 14 items
DRAPE, MINOR, PROC, 6X6 FEN, STER (Drapes/towels) ×1
DRESSING - SMALL TEGADERM 2 X 2 (Dressings/packing) ×2
GEL DOPPLER 20 ML STER AQUASONIC (Misc Medical Supply) ×1
GLOVES BIOGEL PI 6.0 LF PF NON-LATEX (Gloves/Gowns) ×1
GLOVES SUPERSEN 7.5 LTX PF (Gloves/Gowns) ×1
GOWN SURGICAL XL NON-REINFORCED RAGLAN SLEEVE AURORA STERILE (Gloves/Gowns) ×2
NEEDLE - CHIBA 22G X 10CM (Needles/punch/cannula/biopsy) ×1
PACK - CT BX (Procedure Packs/kits) ×1
STERILE WATER IRRIG 1000 ML BTL ×1
Sponge Gauze 2x2in 4-Ply Non-woven 2pk Sterile (Dressings/packing) ×1
Syringe 10ml Luer-Lok Tip Disp (Misc Medical Supply) ×2
Tray Biopsy Bone Lesion Coaxial 10-12g 102-147mm OnControl (Kits/Sets/Trays) ×2
iceforce 2.1 cx 90 degrees (Needles/punch/cannula/biopsy) ×2
single basin set -LF (Misc Surgical Supply) ×1

## 2022-03-20 NOTE — Anesthesia Postprocedure Evaluation (Signed)
Anesthesia Post Note    Patient: Shelly Neal    Procedure(s) Performed: Procedure(s) with comments:  Right iliac bone mass biopsy, cryoablation and SI joint injection - 6/23:Please schedule 7/7 or earlier if cancellations   CT 4hr (including anesthesia);GA ;Prone right;Possible 23hr obs  CBC, CMP (re-draw if scheduled after 7/1)  PT/INR/PTT @ [facility] (Please have pt. Draw [address])  EKG: Ordered @ [facility] (Please have pt complete @ [facility] Cardiology)  PSQ: MyChart  Hold NSAIDS (Naproxen) for 5 days prior to procedure  Take pain med in AM with little water  Covid swab 1 day prior  [place]      Final anesthesia type: General    Patient location: PACU    Post anesthesia pain: Due to allergies to several analgesics, pain control was sub-optimum, IV acetaminophen 1000 mg and Oxycodone 10 mg PO worked well. Radiology team is planning to admit her for observation.     Mental status: awake, alert  and oriented    Airway Patent: Yes    Last Vitals:    Vitals Value Taken Time   BP 138/79 03/20/22 1800   Temp 36.5 C 03/20/22 1625   Pulse 62 03/20/22 1800   Resp 18 03/20/22 1800   SpO2 100 % 03/20/22 1800        Temperature >35.5: Yes    Post vital signs: stable    Hydration: adequate    N/V:no    Plan of care per primary team.

## 2022-03-20 NOTE — RN OR/Procedure Note (Signed)
[place]  Dr. Luciana Axe at bedside. MD aware of all allergies and rash from nipple line all the way down bilateral extremities.    1337  Patient verification sign-in with the anesthesia team completed.   Patient name/DOB/MR number verified  Procedure verified and confirmed.  . Allergies verified and confirmed: Yes (confirmed allergy list)  . Type of sedation/anesthesia: GA  . Difficult airway: no  . Glidescope in the procedure room: yes  . ABX ordered by the primary team confirmed: Cleocin IVPB  . Blood thinner: no  . Abnormal labs: n/a  . Other(s): n/a  . Covid-19 not detected on n/a    Patient now under the care of the anesthesia service.    1600  Debrief is to be conducted before the Attending Surgeon leaves the room:    Attending Surgeon, Anesthesiologist, IR nurse(s), IR tech(s) verbally confirm with the team: Dr. Adella Hare    1) Team verbally confirms procedure performed:  Right sided Iliac bone mass biopsy, steroid injection of sacral iliac joint and right sided iliac bone ablation  2) Any equipment/implant problems addressed: none   3) Team reviews any key concerns for recovery and management of patient:    Successful Right sided Iliac bone mass biopsy, steroid injection of sacral iliac joint and right sided iliac bone ablation  o Monitor for pain and bleeding. Page residents when Pt is awake. 23hrs VS discharge home.  o Pt clear to resume diet as tolerated  4) Review of any specimens specific to the procedure:  o EM tech present intra-operative  o Right Iliac samples confirmed by Murray Hodgkins, EM tech  o Total cores retrieved: 5  o Right Iliac bone samples will be sent to lab for culture   5) Intra-operative complication: none

## 2022-03-20 NOTE — RN OR/Procedure Note (Signed)
1054  Arrived to [facility]    1115  Arrived to [place] started    1117  Anesthesia at bedside speaking to patient about procedure     1130  Message sent to IR consult pager regarding need for consent and H&P    1138  MD at bedside for consent, and H&P including the ASA class and Mallampati score assessment for moderate sedation.    1150  Cytology called made aware of procedure     1155  Pre-Op completed

## 2022-03-20 NOTE — RN OR/Procedure Note (Signed)
1655  Bedside report received from [facility] procedure nurse. Assumed care of patient.     1703  Pt given 46mg fentanyl not much relief from her pain. Paged MD ALyndon Codeinforming patient about 10/10 pain    1715  Anesthesia attending ordered tylenol for patient      1720  Dr. BLuciana Axepaged in regards of pt in severe 10/10 pain    163 Dr. BLuciana Axecalled back in regards of our page. Stated to admit to 24hr obs and possibly start a PCA    1725  Dr. ALyndon Codecame bedside assessed the patient and stated that she would put in orders.     1Lelandwith Dr. OLillia Paulsand gave him information about the patient. Plan is to admit to 24 hour obs    1805  Pt [facility]     1834  Report given to NSeven PointsNP at bedside plan is for 23 hour obs. Per [facility] if pt is 23 hour obs her negative  covid test from 7/5 will suffice    1857  Pt on the transport log to [facility]     1905  [facility]

## 2022-03-20 NOTE — Telephone Encounter (Signed)
Refill is not appropriate at this time. Patient is currently admitted to [facility]

## 2022-03-20 NOTE — Anesthesia Procedure Notes (Signed)
Intubation    Date/Time: 03/20/2022 2:24 PM    Performed by: Precious Bard, CRNA  Authorized by: Massie Bougie, MD    Procedure Details:     Pre-oxygenated: Yes      Induction:  IV    Mask Ventilation:  Easy    Blade:  ProVu 3    Blade (2nd attempt):     View Grade:  1    View Grade (2nd attempt):     Tube type:  Oral cuffed    Tube type (2nd attempt):     Tube size (mm):  7.0    Tube size (mm) (2nd attempt):     Tube Secured at (cm):  22'@lips'$     Cricoid pressure: Yes (needed BURP)      Stylet: Yes      Bougie Used: No      Dental damage: No      Lip/Other damage: No      Bite block inserted:  Gauze    ET-CO2 present: Yes      Breath sounds equal: Yes      Eyes taped: Yes    Patient has difficult airway?  No

## 2022-03-20 NOTE — H&P (Signed)
IM ADMISSION HISTORY & PHYSICAL NOTE - [place]     Date of Evaluation: 03/20/2022     Attending Physician: Herbie Drape, MD    Service: Medicine Hospitalist Team O     History of Presenting Illness:     Shelly Neal is a76 year oldfemale with history of complex pelvic pain issues, including previous surgery for "nonbacterialosteomyelitis of the pelvis," 3 years ago at [facility]. She was discharged from our service on 03/19/22. Came in for outpatient bone biopsy as well as cryoablation and SI joint injection via IR today (03/20/22) but was unable to get her pain controlled after the procedure and is now being placed in observation for pain control. Currently patient denies any SI/HI. Denies any fevers, chills, N/V, abdominal pain or dysuria, cp, sob, palpitations, dizziness, ha.     MRI from 02/20/22 showed"Enhancing lesions of the right iliac bone and left sacral ala, which may be related to a chronic infectious process or a neoplastic disease such as lymphoma. Recommend correlation with prior imaging if available and biopsy if clinically indicated."    Hospital course:   VSS, afebrile, normotensive.   Laboratory work up unremarkable with exception of wbc 16.2 (w/o e/o infection). Plts 87 (with no active bleed).     [Please note that portions of the above note may have been dictated with M-Modal Voice Activated Software which may have produced minimal typographical errors or sound-alike word changes.]        Past History:      Past Medical/Surgical History:  Past Medical History:   Diagnosis Date   . Cataract    . Chronic osteomyelitis, pelvic region and thigh (CMS-HCC)    . Dry eye    . Hypothyroidism    . RA (rheumatoid arthritis) (CMS-HCC)        Home Medications:  No current facility-administered medications on file prior to encounter.     Current Outpatient Medications on File Prior to Encounter   Medication Sig Dispense Refill   . [DISCONTINUED] acetaminophen (TYLENOL) 500 MG tablet Take 1  tablet (500 mg) by mouth every 6 hours as needed.     Marland Kitchen albuterol 108 (90 Base) MCG/ACT inhaler Inhale 1 puff by mouth every 6 hours as needed for Shortness of Breath or Wheezing.     Marland Kitchen anterior blue eye kit USED AS INSTRUCTED BY YOUR PHYSICIAN (KIT FOR RIGHT EYE) 1 kit 0   . [DISCONTINUED] Azelastine HCl 0.15 % SOLN 1 drop in each nostril     . Azelastine HCl 137 MCG/SPRAY SOLN SPRAY 1 SPRAY INTO EACH NOSTRIL TWICE A DAY     . [DISCONTINUED] budesonide-formoterol (SYMBICORT) 160-4.5 MCG/ACT inhaler Inhale 2 puffs by mouth.     . budesonide-formoterol (SYMBICORT) 160-4.5 MCG/ACT inhaler Inhale 2 puffs by mouth as needed.     . Carboxymethylcellulose Sodium (THERATEARS) 0.25 % SOLN Place 1 drop into both eyes daily.     . [DISCONTINUED] cetirizine (ZYRTEC) 10 MG tablet Take 1 tablet (10 mg) by mouth daily.     . cetirizine (ZYRTEC) 5 MG tablet Take 2 tablets (10 mg) by mouth.     . [DISCONTINUED] cycloSPORINE (RESTASIS) 0.05 % ophthalmic emulsion 1 drop by Ophthalmic route.     . cycloSPORINE (RESTASIS) 0.05 % ophthalmic emulsion Place 1 drop into both eyes 2 times daily. 2 each 0   . ergocalciferol (VITAMIN D) 50000 UNIT capsule TAKE 1 CAPSULE BY MOUTH WEEKLY FOR 8 WEEKS     . [DISCONTINUED]  fentaNYL (DURAGESIC) 25 MCG/HR patch PLACE 1 PATCH ONTO THE SKIN EVERY 72 HOURS.     . fluorometholone (FML) 0.1 % ophthalmic suspension Place 1 drop into both eyes 4 times daily. 1 bottle 0   . [DISCONTINUED] fluticasone propionate (FLONASE) 50 MCG/ACT nasal spray 1 spray by Nasal route.     . fluticasone-salmeterol (WIXELA INHUB) 250-50 MCG/DOSE inhaler      . folic acid (FOLVITE) 1 MG tablet 1 tablet (1 mg) by Oral route.     . [DISCONTINUED] HYDROcodone-acetaminophen (NORCO) 10-325 MG tablet      . levothyroxine (SYNTHROID) 88 MCG tablet Take 1 tablet (88 mcg) by mouth.     . lidocaine-prilocaine (EMLA) 2.5-2.5 % cream APPLY 1 APPLICATION ON THE SKIN AS DIRECTED APPLY 10 GRAMS TO SKIN 30 MINUTES PRIOR TO PROCEDURE.     .  methotrexate (RHEUMATREX) 250 MG/10ML SOLN injection INJECT 0.8 ML EVERY 7 DAYS--MULTIDOSE VIALS     . [DISCONTINUED] methotrexate (TREXALL) 2.5 MG tablet As Directed     . Methotrexate, PF, (RASUVO) 7.5 MG/0.15ML SOAJ Inject 0.15 mL under the skin every 7 days.     . naloxegol oxalate (MOVANTIK) 12.5 MG TABS tablet Take 12.5-25 mg by mouth.     . naloxone (NARCAN) 4 mg/0.1 mL nasal spray 1 spray.     . [DISCONTINUED] naproxen (ALEVE) 220 MG tablet Take by mouth.     . [DISCONTINUED] nitrofurantoin monohydrate (MACROBID) 100 MG capsule TAKE 1 CAPSULE BY MOUTH EVERY 12 HOURS WITH FOOD     . [DISCONTINUED] Olopatadine HCl 0.7 % SOLN Place 1 drop into both eyes daily.     Loma Boston Calcium 500 MG TABS 1 tablet     . [DISCONTINUED] phenazopyridine (PYRIDIUM) 100 MG tablet TAKE 1 TABLET BY MOUTH THREE TIMES A DAY AFTER MEALS AS NEEDED     . prednisoLONE acetate (PRED FORTE) 1 % ophthalmic suspension PLACE 1 DROP INTO RIGHT EYE 4 TIMES DAILY. START 3 DAYS PRIOR TO SURGERY     . predniSONE (DELTASONE) 5 MG tablet Take 2 tablets (10 mg) by mouth daily.     . [DISCONTINUED] PREDNISONE PO      . sertraline (ZOLOFT) 100 MG tablet Take 1 tablet (100 mg) by mouth daily.     . [DISCONTINUED] sertraline (ZOLOFT) 100 MG tablet Take 1.5 tablets (150 mg) by mouth daily.     . [DISCONTINUED] SYNTHROID 88 MCG tablet Take 1 tablet (88 mcg) by mouth daily.     Marland Kitchen tiotropium (SPIRIVA HANDIHALER) 18 MCG inhalation capsule Inhale by mouth as needed.     . [DISCONTINUED] tobramycin (TOBREX) 0.3 % ophthalmic solution Place 1 drop into right eye every 4 hours. Start 3 days prior to surgery 1 each 0   . [DISCONTINUED] tobramycin (TOBREX) 0.3 % ophthalmic solution Place 1 drop into right eye 4 times daily. 5 mL 0   . [DISCONTINUED] tobramycin (TOBREX) 0.3 % ophthalmic solution Place 1 drop into right eye 4 times daily. Start 3 days prior to surgery 1 bottle 0   . tocilizumab (ACTEMRA) 162 MG/0.9ML SOSY injection Inject 0.9 mL (162 mg) under  the skin every 7 days.     . [DISCONTINUED] Shirley Friar 5 % SOLN          Social History:  Social History     Socioeconomic History   . Marital status: Married   Tobacco Use   . Smoking status: Never   . Smokeless tobacco: Never   Substance and Sexual Activity   .  Alcohol use: Never   . Drug use: Never       Allergies:  Allergies   Allergen Reactions   . Cefaclor Hives     No problems with amoxicillin/clavulanate - last taken October 2019; Other reaction(s): Pruritic rash (disorder); No problems with amoxicillin/clavulanate - last taken October 2019   . Ciprofloxacin Rash   . Diagnostic X-Ray Materials Rash   . Fd&C Blue #2 [place] Rash   . Gabapentin Nausea and Vomiting   . Hydromorphone Rash     pruritic rash to left arm - no shortness of breath;    Marland Kitchen Ibuprofen Hives and Rash     Aleve;    Marland Kitchen Meloxicam Rash   . Naproxen Rash   . Neomycin-Polymyxin B Gu Rash   . Polyethylene Glycol Hives     Other reaction(s): Pruritic rash (disorder);    Marland Kitchen Pseudoephedrine Rash   . Sulfamethoxazole W-Trimethoprim Hives and Rash     Other reaction(s): Pruritic rash (disorder)   . Guaifenesin Unspecified   . Homeopathic Products Other   . Sulfamethoxazole Other   . Trimethoprim Other   . Iodine Other         Objective:     Review of Systems:    Constitutional: Denies fever, chills, weight loss, weight gain  Head & Neck:  Denies headache, vision change, hearing change, rhinorrhea, sore throat  Respiratory:  Denies shortness of breath, cough, sputum, hemoptysis  Cardiovascular:  Denies chest pain/pressure, orthopnea, PND, palpitations  Gastrointestinal:  Denies abdominal pain, nausea, vomiting, diarrhea, melena, rectal bleeding  Genitourinary:  Denies dysuria, polyuria, hesitancy, frequency  Musculoskeletal:  Denies joint pain, leg edema  Neurological:  Denies focal numbness, weakness, seizure  All other Review of Systems is negative.     Selected Medications:  SCHEDULED MEDS:  . acetaminophen  975 mg Q8H   . [START ON 03/21/2022]  cetirizine  10 mg Daily   . [START ON 05/19/1739] folic acid  1 mg Daily   . heparin  5,000 Units Q12H   . [START ON 03/21/2022] levothyroxine  88 mcg QAM AC   . methocarbamol  500 mg 4x Daily   . [START ON 03/21/2022] predniSONE  10 mg Daily   . senna  8.6 mg HS   . [START ON 03/21/2022] sertraline  100 mg Daily       PRN MEDS:  . acetaminophen  650 mg Q4H PRN    Or   . acetaminophen  650 mg Q4H PRN   . aluminum hydroxide-magnesium hydroxide-simethicone  15 mL Q6H PRN   . diphenhydrAMINE  25 mg Q4H PRN   . nalOXone  0.2 mg Once PRN   . ondansetron  4 mg Once PRN   . ondansetron  4 mg Q6H PRN    Or   . ondansetron  4 mg Q6H PRN   . oxyCODONE  10 mg Q4H PRN   . oxyCODONE  15 mg Q4H PRN       Physical Exam:  Vital Signs:  Ht.:  ,     BMI: There is no height or weight on file to calculate BMI.  Temperature:  [97.7 F (36.5 C)-97.9 F (36.6 C)] 97.7 F (36.5 C) (07/07 1625)  Blood pressure (BP): (105-162)/(49-72) 143/63 (07/07 1733)  Heart Rate:  [53-66] 66 (07/07 1733)  Respirations:  [11-27] 16 (07/07 1736)  Pain Score: 10 (07/07 1736)  O2 Device: None (Room air) (07/07 1736)  O2 Flow Rate (L/min):  [4 l/min-6 l/min] 4  l/min (07/07 1703)  SpO2:  [94 %-100 %] 98 % (07/07 1733)    Nursing Notes Reviewed:     Intake/Output Summary (Last 24 hours) at 03/20/2022 1812  Last data filed at 03/20/2022 1620  Gross per 24 hour   Intake 300 ml   Output --   Net 300 ml     Physical Exam:  General: 76 year old in moderate distress secondary to pain  HEENT: normocephalic, atraumatic, anicteric sclera, neck supple  Card: normal S1 S2, regular rate & rhythm, no murmurs  Pulm: CTAB, normal respiratory effort, no wheezes  Abd: soft, nontender, nondistended, no guarding rigidity or rebound tenderness  Skin: Rashes to LE   Ext: warm, well perfused, no edema, Right hip pain   Neuro: A&O x3, no focal deficits  Psych: normal mood and affect      Patient Lines/Drains/Airways Status     Active PICC Line / CVC Line / PIV Line / Drain / Airway /  Intraosseous Line / Epidural Line / ART Line / Line Type / Wound / Pressure Ulcer Injury     Name Placement date Placement time Site Days    Peripheral IV - 20 G Right Forearm 03/20/22  1208  Forearm  less than 1    Peripheral IV - 20 G Left Hand 03/20/22  1400  Hand  less than 1                 Diagnostic Data:     Laboratory Data:  Recent Labs     03/18/22  1637 03/19/22  0310   WBCCOUNT 13.3* 16.2*   HGB 13.3 13.6   HCT 40.4 39.7   PLT 80* 87*       Recent Labs     03/18/22  1637 03/19/22  0310   SODIUM 139 138   K 3.6 3.6   CL 105 103   CO2 26 25   BUN 16 16   CREAT 0.7 0.8   GLU 98 108   [place] 9.2 8.8   MG  --  1.9   TPROT 6.4 6.2   ALB 4.0 3.9   ALK 40 41   TBILI 0.6 0.5   AST 24 22   ALT 30 29     Recent Labs     03/18/22  1637   PROTIME 13.1   INR 1.00   PTT 25.0       Cardiac Enzymes:  No results for input(s): TROPI, TCPK, CPK, CKMB, CKIND, BNP, DIGOX in the last 72 hours.    [place]: 8.8 (07/06)  Mg: 1.9 (07/06)    Anion Gap: 10 (07/06)    MICROBIOLOGY  Microbiology Results (last 7 days)     Procedure Component Value - Date/Time    Coronavirus Disease 2019 (COVID-19) SCOVS [[id number]] Collected: 03/18/22 1905    Lab Status: Final result Specimen: Swab from Nasal-Pharyngeal Updated: 03/18/22 2033     COVID-19 Source SWAB     Comment: NASOPHARYNX        COVID-19 Result NOT DETECTED     COVID-19 Comment Reference range: Not Detected     Comment: An interpretation of Not Detected cannot exclude the presence of SARS-CoV-2 RNA   concentrations below detection limits. The Xpert Xpress SARS-CoV-2 plus is a   real-time reverse transcription polymerase chain reaction (rRT-PCR) intended   for the qualitative detection of nucleic acid from the SARS-CoV-2.  This test has been authorized by the Food and Drug Administration (FDA) under an  Emergency Use Authorization (EUA) for use by laboratories certified to perform moderate and high complexity testing under the Clinical Laboratory Improvement Amendments   (CLIA).                Other Diagnostic Data:  MRI Pelvis Bone W/WO Contrast    Result Date: 02/27/2022  Enhancing lesions of the right iliac bone and left sacral ala, which may be related to a chronic infectious process or a neoplastic disease such as lymphoma. Recommend correlation with prior imaging if available and biopsy if clinically indicated. PET/CT may also provide additional information. I have personally reviewed the images upon which this report is based and agree with the findings and conclusions expressed above.    CT Chest W/O Contrast    Result Date: 02/22/2022  Lucent changes in the T5, T9, T10, and T11 vertebral bodies, nonspecific and may be hemangiomas. Sclerotic changes in the T5 vertebral body, more than expected for degenerative change. Recommend MR thoracic spine without and with intravenous contrast for better evaluation. 6 mm pleural-based right lower lobe nodule. Few bilateral pleural-based and nonpleural-based micronodules. Linear scarring/atelectasis in bilateral lower lobes. Peribronchial wall thickening. Scattered mucous plugging. 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 Small pericardial effusion. Chronic findings, as above.     CT Abdomen And Pelvis W/O Contrast    Result Date: 02/21/2022  1.  Multiple sclerotic bone lesions, concerning for metastatic malignancy. Evaluation for neoplastic process is limited by lack of intravenous contrast. No abnormality suspicious for malignancy identified in the abdomen and pelvis at noncontrast CT. END       @ECHORESULT @     Assessment & Plan:     Impression:  Shelly Neal is a1 year oldfemale with history of complex pelvic pain issues, including previous surgery for "nonbacterialosteomyelitis of the pelvis," 3 years ago at [facility] being admitted for intractable right hip pain 2/2 lytic lesions.    Assessment:  #chronic right pelvic/hip pain 2/2 chronic osteomyelitis of the pelvis since 2020? Patient followed by [facility] ortho and pain management.  Underwent bone biopsy and cryoablation and SI joint injection via IR. Procedure went well but patient has had intractable pain since the procedure.  #Enhancing lesions of the right iliac bone and left sacra ala, concerning for infectious process vs neoplastic disease.   #RA, followed by outpatient rheumatologist. Patient with LE rashes, taking prednisone daily.     Plan:  -Admit to Observation  -start multimodal pain control: apap, robaxin, oxycodone 10/24m PRN, morphine breakthrough pain   -consider pain management consult if pain is not controlled by AM  -Reconcile home medications as patient in severe pain at this time  -AM labs  -FEN: regular diet  -Heparin sc for vte ppx  -DISPO: anticipate less than 2 midnights     Code Status: full code  To be staffed with attending, Dr. OLillia Pauls AMadelynn Done MD, in the morning.    Best Regards,  TDennison Nancy NP  Internal Medicine      ATTENDING ATTESTATION:  ATTENDING ATTESTATION:  I saw and evaluated the patient with the APP. I provided a substantive portion of the care of this patient.  I have reviewed and verified this documentation which accurately reflects our care.  I have independently performed the assessment and plan and documented it within the note.     76year old F with chronic pain with multiple osseous lesions admitted for postoperative pain control after having bone biopsy and cryoablation of R iliac bone lesion. Started  on multimodal pain regimen.    Herbie Drape, MD

## 2022-03-20 NOTE — Brief Op Note (Signed)
BRIEF OPERATIVE NOTE    DATE: 03/20/2022  TIME: 6:15 PM    PREOPERATIVE DIAGNOSIS: Multiple suspicious osseous lesions    POSTOPERATIVE DIAGNOSIS: Same    PROCEDURE INFORMATION:  Procedure(s):  Right iliac bone mass biopsy, cryoablation and SI joint injection - Wound Class: Class I (Clean) - Incision Closure: No Incision / NA    ATTENDING SURGEON:   Surgeon(s) and Role:     Tawny Hopping, MD - Primary     * Dallas Schimke, MD - Resident - Assisting    ANESTHESIA: GA    FINDINGS: Sclerotic R iliac lesion    SPECIMENS: 6 samples sent to surgical pathology    Fluids/Blood Products:      IV Fluids: Per nursing    Blood Products: None    EBL: Negligible    Urine Output: Per anesthesia    COMPLICATIONS: None    DISPOSITION: Successful R iliac bone lesion biopsy with subsequent ablation and R SI joint injection.    - Admit to medicine for observation and pain management

## 2022-03-20 NOTE — H&P (Signed)
Shelly Neal is a 76 year old female,   Scheduled for Procedure(s):  Right iliac bone mass biopsy, cryoablation and SI joint injection on   03/20/2022 - POD Day of Surgery    PMHx:   Past Medical History:   Diagnosis Date   . Cataract    . Chronic osteomyelitis, pelvic region and thigh (CMS-HCC)    . Dry eye    . Hypothyroidism    . RA (rheumatoid arthritis) (CMS-HCC)      Surgical HX:   Past Surgical History:   Procedure Laterality Date   . CATARACT EXTRACTION Right 09/02/2020   . BONE RESECTION Right 2019    [facility] meds:   Medications Prior to Admission   Medication Sig Dispense Refill Last Dose   . acetaminophen (TYLENOL) 500 MG tablet Take 2 tablets (1,000 mg) by mouth every 8 hours. 30 tablet 0    . albuterol 108 (90 Base) MCG/ACT inhaler Inhale 1 puff by mouth every 6 hours as needed for Shortness of Breath or Wheezing.      Marland Kitchen anterior blue eye kit USED AS INSTRUCTED BY YOUR PHYSICIAN (KIT FOR RIGHT EYE) 1 kit 0    . Azelastine HCl 137 MCG/SPRAY SOLN SPRAY 1 SPRAY INTO EACH NOSTRIL TWICE A DAY      . budesonide-formoterol (SYMBICORT) 160-4.5 MCG/ACT inhaler Inhale 2 puffs by mouth as needed.      . Carboxymethylcellulose Sodium (THERATEARS) 0.25 % SOLN Place 1 drop into both eyes daily.      . cetirizine (ZYRTEC) 5 MG tablet Take 2 tablets (10 mg) by mouth.      . cycloSPORINE (RESTASIS) 0.05 % ophthalmic emulsion Place 1 drop into both eyes 2 times daily. 2 each 0    . ergocalciferol (VITAMIN D) 50000 UNIT capsule TAKE 1 CAPSULE BY MOUTH WEEKLY FOR 8 WEEKS      . fluorometholone (FML) 0.1 % ophthalmic suspension Place 1 drop into both eyes 4 times daily. 1 bottle 0    . fluticasone-salmeterol (WIXELA INHUB) 250-50 MCG/DOSE inhaler       . folic acid (FOLVITE) 1 MG tablet 1 tablet (1 mg) by Oral route.      Marland Kitchen levothyroxine (SYNTHROID) 88 MCG tablet Take 1 tablet (88 mcg) by mouth.      . lidocaine-prilocaine (EMLA) 2.5-2.5 % cream APPLY 1 APPLICATION ON THE SKIN AS DIRECTED APPLY 10 GRAMS TO SKIN  30 MINUTES PRIOR TO PROCEDURE.      . methotrexate (RHEUMATREX) 250 MG/10ML SOLN injection INJECT 0.8 ML EVERY 7 DAYS--MULTIDOSE VIALS      . Methotrexate, PF, (RASUVO) 7.5 MG/0.15ML SOAJ Inject 0.15 mL under the skin every 7 days.      . naloxegol oxalate (MOVANTIK) 12.5 MG TABS tablet Take 12.5-25 mg by mouth.      . naloxone (NARCAN) 4 mg/0.1 mL nasal spray 1 spray.      Marland Kitchen oxyCODONE (ROXICODONE) 15 MG immediate release tablet Take 1 tablet (15 mg) by mouth every 8 hours as needed for Moderate Pain (Pain Score 4-6) or Severe Pain (Pain Score 7-10) for up to 21 doses. 21 tablet 0    . Oyster Shell Calcium 500 MG TABS 1 tablet      . prednisoLONE acetate (PRED FORTE) 1 % ophthalmic suspension PLACE 1 DROP INTO RIGHT EYE 4 TIMES DAILY. START 3 DAYS PRIOR TO SURGERY      . predniSONE (DELTASONE) 5 MG tablet Take 2 tablets (10 mg) by  mouth daily.      Marland Kitchen senna (SENOKOT) 8.6 MG tablet Take 1 tablet (8.6 mg) by mouth at bedtime. 30 tablet 0    . sertraline (ZOLOFT) 100 MG tablet Take 1 tablet (100 mg) by mouth daily.      Marland Kitchen tiotropium (SPIRIVA HANDIHALER) 18 MCG inhalation capsule Inhale by mouth as needed.      . tizanidine (ZANAFLEX) 2 MG tablet Take 1 tablet (2 mg) by mouth 2 times daily as needed (muscle spasm right hip). 69 tablet 0    . tocilizumab (ACTEMRA) 162 MG/0.9ML SOSY injection Inject 0.9 mL (162 mg) under the skin every 7 days.        Current Inpatient Medications:  . triamcinolone acetonide  40 mg Once     . sodium chloride           ALLERGIES:  Allergies   Allergen Reactions   . Cefaclor Hives     No problems with amoxicillin/clavulanate - last taken October 2019; Other reaction(s): Pruritic rash (disorder); No problems with amoxicillin/clavulanate - last taken October 2019   . Ciprofloxacin Rash   . Diagnostic X-Ray Materials Rash   . Fd&C Blue #2 [place] Rash   . Gabapentin Nausea and Vomiting   . Hydromorphone Rash     pruritic rash to left arm - no shortness of breath;    Marland Kitchen Ibuprofen Hives and  Rash     Aleve;    Marland Kitchen Meloxicam Rash   . Naproxen Rash   . Neomycin-Polymyxin B Gu Rash   . Polyethylene Glycol Hives     Other reaction(s): Pruritic rash (disorder);    Marland Kitchen Pseudoephedrine Rash   . Sulfamethoxazole W-Trimethoprim Hives and Rash     Other reaction(s): Pruritic rash (disorder)   . Guaifenesin Unspecified   . Homeopathic Products Other   . Sulfamethoxazole Other   . Trimethoprim Other   . Iodine Other       History and Physical      Review of systems  Constitutional:  Denies Fever  HEENT:  Denies tinnitus  Cardiovascular:  Denies chest pain  Respiratory:  Denies difficulty breathing  Gastrointestinal:  Denies indigestion  Neurological:  Denies headache  Allergic/Immunologic:  Denies hives  Dermatological:  Denies Skin Rash    Physical Exam:  General Appearance:  Well appearing and No acute distress  HEENT:  EOMI  Respiratory:  Good chest wall excursion  Neurologic:  Awake and Alert  GU:  Normal  GI:  Abdomen non-distended      ASA Classification comment:  Per anesthesia           Sedation to be used?: No (general anesthesia)                                There were no vitals filed for this visit.    LABS:  Admission on 03/18/2022, Discharged on 03/19/2022   Component Date Value Ref Range Status   . White Bld Cell Count 03/18/2022 13.3 (H)  4.0 - 10.5 THOUS/MCL Final   . RBC 03/18/2022 4.42  3.70 - 5.00 MILL/MCL Final   . Hgb 03/18/2022 13.3  11.5 - 15.0 G/DL Final   . Hematocrit 03/18/2022 40.4  34.0 - 44.0 % Final   . MCV 03/18/2022 91.4  81.5 - 97.0 FL Final   . MCH 03/18/2022 30.1  27.0 - 33.5 PG Final   . MCHC 03/18/2022 32.9  32.0 -  35.5 G/DL Final   . RDW-CV 03/18/2022 14.8 (H)  11.6 - 14.4 % Final   . PLT Count 03/18/2022 80 (L)  150 - 400 THOUS/MCL Final    PLATELET COUNT CONFIRMED BY SLIDE ESTIMATE   . MPV 03/18/2022 9.3  7.2 - 11.7 FL Final   . Diff Type 03/18/2022 PERIPHERAL SMEAR WAS REVIEWED   Final   . Neutrophils % (A) 03/18/2022 80.7  % Final   . ANC automated 03/18/2022 10.7 (H)  2.0 -  8.1 THOUS/MCL Final   . Lymphocytes % 03/18/2022 14.9  % Final   . Lymphocytes Absolute 03/18/2022 2.0  0.9 - 3.3 THOUS/MCL Final   . Monocytes % 03/18/2022 2.8  % Final   . Monocytes Absolute 03/18/2022 0.4  0.0 - 0.8 THOUS/MCL Final   . Eosinophils % 03/18/2022 1.3  % Final   . Eosinophils Absolute 03/18/2022 0.2  0.0 - 0.5 THOUS/MCL Final   . Basophils % 03/18/2022 0.3  % Final   . Basophils Absolute 03/18/2022 0.0  0.0 - 0.2 THOUS/MCL Final   . RBC Morphology 03/18/2022 NORMAL   Final   . Platelet Morphology 03/18/2022 NORMAL   Final   . Prothrombin Time 03/18/2022 13.1  12.0 - 14.2 SEC Final   . INR 03/18/2022 1.00  0.89 - 1.11 Final    Comment:    Therapy guidelines using the INR for patients stabilized on oral anticoagulant   therapy:       Moderate Intensity Regimen  2.0 - 3.0 INR  High Intensity Regimen  2.5 - 3.5 INR       The physician must determine the best INR range depending on the reason for   anticoagulant treatment and how each individual responds to treatment.       Reference: Chest [id number], 2008          . PTT 03/18/2022 25.0  24.2 - 36.7 SEC Final    Comment:    Therapeutic Range for DVT or PE:    Unfractionated Heparin: 65-110 seconds, 6 hrs after bolus    Argatroban: 50-100 seconds, not to exceed 100 seconds     . Sodium 03/18/2022 139  136 - 145 mmol/L Final   . Potassium 03/18/2022 3.6  3.5 - 5.1 mmol/L Final   . Chloride 03/18/2022 105  98 - 107 mmol/L Final   . CO2 03/18/2022 26  21 - 31 mmol/L Final   . Electrolyte Balance 03/18/2022 8  2 - 12 mmol/L Final   . Glucose 03/18/2022 98  85 - 125 mg/dL Final    Comment:    Normal Fasting Glucose:  <100 mg/dL  Impaired Fasting Glucose: 100-125 mg/dL  Provisional DX of diabetes(must be confirmed) >125 mg/dL     . BUN 03/18/2022 16  7 - 25 mg/dL Final   . Creat 03/18/2022 0.7  0.6 - 1.2 mg/dL Final   . eGFR - low estimate 03/18/2022 >60  >59 Final   . eGFR - high estimate 03/18/2022 >60  >59 Final    Comment: (Unit: mL/min/1.73 sq mtr)  The  calculated GFR is an estimate and is NOT an accurate reflection of GFR in   patients on dialysis. The accuracy of estimated GFR is also affected by muscle   mass, and medications that affect renal tubular secretion of creatinine.  If estimated GFR will directly affect clinical decision making, consider using cystatin C to estimate GFR, and/or consult with nephrology. eGFR was calculated using the MDRD equation (2006).     Marland Kitchen  Calcium 03/18/2022 9.2  8.6 - 10.3 mg/dL Final   . Protein, Total 03/18/2022 6.4  6.0 - 8.3 G/DL Final   . Albumin 03/18/2022 4.0  3.7 - 5.3 G/DL Final   . Alk Phos 03/18/2022 40  34 - 104 U/L Final   . AST 03/18/2022 24  13 - 39 U/L Final   . ALT 03/18/2022 30  7 - 52 U/L Final   . Bilirubin, Total 03/18/2022 0.6  0.0 - 1.4 mg/dL Final   . UR Sample Site, UA 03/18/2022 URINE,TYPE NOT SPECIFIED   Final    URINE, CLEAN VOID   . Color, UA 03/18/2022 YELLOW   Final   . Clarity 03/18/2022 TURBID   Final   . Specific Grav, UA 03/18/2022 1.018  1.003 - 1.030 Final   . pH, UA 03/18/2022 6.5  5.0 - 8.0 Final   . Protein, UA 03/18/2022 10 (A)  NEGATIVE MG/DL Final   . Glucose, UA 03/18/2022 NEGATIVE  NEGATIVE MG/DL Final   . Ketones, UA 03/18/2022 NEGATIVE  NEGATIVE MG/DL Final   . Bilirubin, UA 03/18/2022 NEGATIVE  NEGATIVE Final   . Hemoglobin, UA 03/18/2022 SMALL (A)  NEGATIVE Final   . Leukocyte Esterase, UA 03/18/2022 LARGE (A)  NEGATIVE Final   . Nitrite, UA 03/18/2022 NEGATIVE  NEGATIVE Final   . Urobilinogen, UA 03/18/2022 <2  <2 MG/DL Final   . RBC, UA 03/18/2022 16 (H)  0 - 3 #/HPF Final   . WBC, UA 03/18/2022 >182 (H)  0 - 5 #/HPF Final   . WBC Clumps, UA 03/18/2022 MANY (A)  NONE #/HPF Final   . Bacteria, UA 03/18/2022 MODERATE (A)  NONE Final   . Squamous Epithelial, UA 03/18/2022 1  0 - 10 /HPF Final   . Mucous, UA 03/18/2022 MODERATE (A)  NONE /LPF Final   . Drug Screen Type 03/18/2022 Screening results are presumptive and should be interpreted with caution in conjunction with other  clinical information.   Final    Comment: Due to the possibility of false positives and false negatives confirmatory   testing is recommended for identification of specific drugs and unexpected   results.  Unconfirmed results are not intended for any legal purpose.     Marland Kitchen Opiates 03/18/2022 POSITIVE SCREEN (A)  UNDETECTED Final   . Cocaine 03/18/2022 UNDETECTED  UNDETECTED Final   . PCP 03/18/2022 UNDETECTED  UNDETECTED Final   . Amphetamines, Urine 03/18/2022 UNDETECTED  UNDETECTED Final   . THC 03/18/2022 POSITIVE SCREEN (A)  UNDETECTED Final   . Methadone 03/18/2022 UNDETECTED  UNDETECTED Final   . Propoxyphene 03/18/2022 UNDETECTED  UNDETECTED Final   . Benzodiazepines 03/18/2022 UNDETECTED  UNDETECTED Final   . Barbiturates 03/18/2022 UNDETECTED  UNDETECTED Final   . MDMA 03/18/2022 UNDETECTED  UNDETECTED Final   . Oxycodone Lvl, UR 03/18/2022 POSITIVE SCREEN (A)  UNDETECTED Final   . Fentanyl,  U Scrn 03/18/2022 UNDETECTED  UNDETECTED Final   . Drug Screen Cutoff Concentration 03/18/2022 Drugs Covered and Cutoff Concentration:   Final    Comment: Amphetamines (500 ng/mL), Barbiturates  (300 ng/mL), Benzodiazepines (200 ng/mL),  Cocaine (150 ng/mL), Methadone (300 ng/mL),  MDMA (500 ng/mL), Opiates (300 ng/mL),  PCP (25 ng/mL), Propoxyphene (300 ng/mL),  Oxycodone (100 ng/mL), Fentanyl (1 ng/mL),  THC (50 ng/mL).     Marland Kitchen COVID-19 Source 03/18/2022 SWAB   Final    NASOPHARYNX   . COVID-19 Result 03/18/2022 NOT DETECTED   Final   . COVID-19 Comment 03/18/2022 Reference range:  Not Detected   Final    Comment: An interpretation of Not Detected cannot exclude the presence of SARS-CoV-2 RNA   concentrations below detection limits. The Xpert Xpress SARS-CoV-2 plus is a   real-time reverse transcription polymerase chain reaction (rRT-PCR) intended   for the qualitative detection of nucleic acid from the SARS-CoV-2.  This test has been authorized by the Food and Drug Administration (FDA) under an Emergency Use  Authorization (EUA) for use by laboratories certified to perform moderate and high complexity testing under the Clinical Laboratory Improvement Amendments   (CLIA).     Dema Severin Bld Cell Count 03/19/2022 16.2 (H)  4.0 - 10.5 THOUS/MCL Final   . RBC 03/19/2022 4.33  3.70 - 5.00 MILL/MCL Final   . Hgb 03/19/2022 13.6  11.5 - 15.0 G/DL Final   . Hematocrit 03/19/2022 39.7  34.0 - 44.0 % Final   . MCV 03/19/2022 91.8  81.5 - 97.0 FL Final   . MCH 03/19/2022 31.4  27.0 - 33.5 PG Final   . MCHC 03/19/2022 34.3  32.0 - 35.5 G/DL Final   . RDW-CV 03/19/2022 14.7 (H)  11.6 - 14.4 % Final   . PLT Count 03/19/2022 87 (L)  150 - 400 THOUS/MCL Final   . MPV 03/19/2022 9.8  7.2 - 11.7 FL Final   . Diff Type 03/19/2022 DIFFERENTIAL PERFORMED BY AUTOMATED ANALYSIS   Final   . Neutrophils % (A) 03/19/2022 81.7  % Final   . ANC automated 03/19/2022 13.2 (H)  2.0 - 8.1 THOUS/MCL Final   . Lymphocytes % 03/19/2022 14.5  % Final   . Lymphocytes Absolute 03/19/2022 2.3  0.9 - 3.3 THOUS/MCL Final   . Monocytes % 03/19/2022 2.4  % Final   . Monocytes Absolute 03/19/2022 0.4  0.0 - 0.8 THOUS/MCL Final   . Eosinophils % 03/19/2022 1.1  % Final   . Eosinophils Absolute 03/19/2022 0.2  0.0 - 0.5 THOUS/MCL Final   . Basophils % 03/19/2022 0.3  % Final   . Basophils Absolute 03/19/2022 0.0  0.0 - 0.2 THOUS/MCL Final   . Sodium 03/19/2022 138  136 - 145 mmol/L Final   . Potassium 03/19/2022 3.6  3.5 - 5.1 mmol/L Final   . Chloride 03/19/2022 103  98 - 107 mmol/L Final   . CO2 03/19/2022 25  21 - 31 mmol/L Final   . Electrolyte Balance 03/19/2022 10  2 - 12 mmol/L Final   . Glucose 03/19/2022 108  85 - 125 mg/dL Final    Comment:    Normal Fasting Glucose:  <100 mg/dL  Impaired Fasting Glucose: 100-125 mg/dL  Provisional DX of diabetes(must be confirmed) >125 mg/dL     . BUN 03/19/2022 16  7 - 25 mg/dL Final   . Creat 03/19/2022 0.8  0.6 - 1.2 mg/dL Final   . eGFR - low estimate 03/19/2022 >60  >59 Final   . eGFR - high estimate 03/19/2022 >60  >59  Final    Comment: (Unit: mL/min/1.73 sq mtr)  The calculated GFR is an estimate and is NOT an accurate reflection of GFR in   patients on dialysis. The accuracy of estimated GFR is also affected by muscle   mass, and medications that affect renal tubular secretion of creatinine.  If estimated GFR will directly affect clinical decision making, consider using cystatin C to estimate GFR, and/or consult with nephrology. eGFR was calculated using the MDRD equation (2006).     . Calcium 03/19/2022 8.8  8.6 -  10.3 mg/dL Final   . Protein, Total 03/19/2022 6.2  6.0 - 8.3 G/DL Final   . Albumin 03/19/2022 3.9  3.7 - 5.3 G/DL Final   . Alk Phos 03/19/2022 41  34 - 104 U/L Final   . AST 03/19/2022 22  13 - 39 U/L Final   . ALT 03/19/2022 29  7 - 52 U/L Final   . Bilirubin, Total 03/19/2022 0.5  0.0 - 1.4 mg/dL Final   . Magnesium 03/19/2022 1.9  1.9 - 2.7 mg/dL Final      Kathreen Devoid  PGY 5 Integrated IR Resident

## 2022-03-21 MED ORDER — METHYLPREDNISOLONE 8 MG OR TABS
8.0000 mg | ORAL_TABLET | Freq: Every evening | ORAL | 0 refills | Status: AC
Start: 2022-03-21 — End: 2022-03-23

## 2022-03-21 MED ORDER — SENNA 8.6 MG OR TABS
2.0000 | ORAL_TABLET | Freq: Two times a day (BID) | ORAL | Status: DC
Start: 2022-03-21 — End: 2022-03-21
  Administered 2022-03-21: 17.2 mg via ORAL
  Filled 2022-03-21: qty 2

## 2022-03-21 MED ORDER — METHYLPREDNISOLONE 4 MG OR TABS
4.0000 mg | ORAL_TABLET | Freq: Every day | ORAL | Status: DC
Start: 2022-03-22 — End: 2022-03-21
  Filled 2022-03-21: qty 1

## 2022-03-21 MED ORDER — POLYETHYLENE GLYCOL 3350 OR PACK
17.0000 g | PACK | Freq: Two times a day (BID) | ORAL | Status: DC
Start: 2022-03-21 — End: 2022-03-21
  Administered 2022-03-21: 17 g via ORAL
  Filled 2022-03-21: qty 1

## 2022-03-21 MED ORDER — METHOCARBAMOL 750 MG OR TABS
750.0000 mg | ORAL_TABLET | Freq: Four times a day (QID) | ORAL | 0 refills | Status: AC
Start: 2022-03-21 — End: ?

## 2022-03-21 MED ORDER — SENNA 8.6 MG OR TABS
17.2000 mg | ORAL_TABLET | Freq: Two times a day (BID) | ORAL | 0 refills | Status: AC
Start: 2022-03-21 — End: ?

## 2022-03-21 MED ORDER — BISACODYL 10 MG RE SUPP
10.0000 mg | Freq: Every day | RECTAL | Status: DC
Start: 2022-03-21 — End: 2022-03-21

## 2022-03-21 MED ORDER — METHYLPREDNISOLONE 4 MG OR TABS
8.0000 mg | ORAL_TABLET | Freq: Every day | ORAL | Status: DC
Start: 2022-03-21 — End: 2022-03-21
  Filled 2022-03-21: qty 2

## 2022-03-21 MED ORDER — METHYLPREDNISOLONE 4 MG OR TABS
4.0000 mg | ORAL_TABLET | Freq: Every day | ORAL | 0 refills | Status: AC
Start: 2022-03-21 — End: 2022-03-24

## 2022-03-21 MED ORDER — METHYLPREDNISOLONE 4 MG OR TABS
4.0000 mg | ORAL_TABLET | Freq: Every day | ORAL | 0 refills | Status: AC
Start: 2022-03-22 — End: 2022-03-26

## 2022-03-21 MED ORDER — METHYLPREDNISOLONE 4 MG OR TABS
4.0000 mg | ORAL_TABLET | Freq: Every evening | ORAL | 0 refills | Status: AC
Start: 2022-03-23 — End: 2022-03-26

## 2022-03-21 MED ORDER — METHYLPREDNISOLONE 4 MG OR TABS
8.0000 mg | ORAL_TABLET | Freq: Every evening | ORAL | Status: DC
Start: 2022-03-21 — End: 2022-03-21
  Filled 2022-03-21: qty 2

## 2022-03-21 MED ORDER — METHYLPREDNISOLONE 8 MG OR TABS
8.0000 mg | ORAL_TABLET | Freq: Every day | ORAL | 0 refills | Status: AC
Start: 2022-03-21 — End: 2022-03-22

## 2022-03-21 MED ORDER — METHYLPREDNISOLONE 4 MG OR TABS
4.0000 mg | ORAL_TABLET | Freq: Every day | ORAL | Status: DC
Start: 2022-03-21 — End: 2022-03-21
  Filled 2022-03-21: qty 1

## 2022-03-21 MED ORDER — METHYLPREDNISOLONE 4 MG OR TABS
4.0000 mg | ORAL_TABLET | Freq: Every day | ORAL | 0 refills | Status: AC
Start: 2022-03-22 — End: 2022-03-27

## 2022-03-21 MED ORDER — METHYLPREDNISOLONE 4 MG OR TABS
4.0000 mg | ORAL_TABLET | Freq: Every evening | ORAL | Status: DC
Start: 2022-03-23 — End: 2022-03-21
  Filled 2022-03-21: qty 1

## 2022-03-21 MED ORDER — ACETAMINOPHEN 500 MG OR TABS
1000.0000 mg | ORAL_TABLET | Freq: Three times a day (TID) | ORAL | 0 refills | Status: AC | PRN
Start: 2022-03-21 — End: 2022-03-31

## 2022-03-21 NOTE — Discharge Summary (Signed)
DISCHARGE SUMMARY     Patient Name:  Shelly Neal    Date of Admission:  03/20/2022  Date of Discharge:  03/21/22  Discharge Disposition:  Home     Principal Diagnosis:  Intractable procedure related pain     Follow Up Recommendations:    - Please take tylenol 1000 mg three times a day for pain, please take oxycodone 10 or 15 mg as needed for moderate (4-6) or severe pain (7-10 level pain) respectively.    - You have also been prescribed a bowel regimen of senna, can also take benefiber as opioids like oxycodone can cause constipation  - Follow the instructions given to you for your right hip bone biopsy post operative instructions   - Continue your home medications  - Follow-up with your PCP  - Creat a MyChart account so you can follow up on your biopsy results  - See your upcoming future appointments below    Follow Up Appointments:  Scheduled appointments:  Future Appointments   Date Time Provider [place]   03/30/2022  8:00 AM Learned, Elyn Aquas, MD [place] [facility]        Return Precautions  Return to the ER if your symptoms return or worsen.          Discharge Medications:     What To Do With Your Medications      START taking these medications      Add'l Info   methocarbamol 750 MG tablet  Commonly known as: ROBAXIN  Take 1 tablet (750 mg) by mouth 4 times daily.   Quantity: 120 tablet  Refills: 0     * methylPREDNISolone 8 MG tablet  Commonly known as: MEDROL  Take 1 tablet (8 mg) by mouth every morning (before breakfast) for 1 dose.   Quantity: 1 tablet  Refills: 0     * methylPREDNISolone 4 MG tablet  Commonly known as: MEDROL  Take 1 tablet (4 mg) by mouth daily with dinner for 3 doses.   Quantity: 3 tablet  Refills: 0     * methylPREDNISolone 8 MG tablet  Commonly known as: MEDROL  Take 1 tablet (8 mg) by mouth at bedtime for 2 doses.   Quantity: 2 tablet  Refills: 0     * methylPREDNISolone 4 MG tablet  Commonly known as: MEDROL  Take 1 tablet (4 mg) by mouth Daily with lunch for 4  doses.  Start taking on: March 22, 2022   Quantity: 4 tablet  Refills: 0     * methylPREDNISolone 4 MG tablet  Commonly known as: MEDROL  Take 1 tablet (4 mg) by mouth every morning (before breakfast) for 5 doses.  Start taking on: March 22, 2022   Quantity: 5 tablet  Refills: 0     * methylPREDNISolone 4 MG tablet  Commonly known as: MEDROL  Take 1 tablet (4 mg) by mouth at bedtime for 3 doses.  Start taking on: March 23, 2022   Quantity: 3 tablet  Refills: 0         * This list has 6 medication(s) that are the same as other medications prescribed for you. Read the directions carefully, and ask your doctor or other care provider to review them with you.            CHANGE how you take these medications      Add'l Info   acetaminophen 500 MG tablet  Commonly known as: TYLENOL  Take 2 tablets (1,000 mg) by  mouth every 8 hours as needed for Mild Pain (Pain Score 1-3) for up to 10 days.   Quantity: 30 tablet  Refills: 0  What changed:    when to take this   reasons to take this     senna 8.6 MG tablet  Commonly known as: SENOKOT  Take 2 tablets (17.2 mg) by mouth 2 times daily.   Quantity: 30 tablet  Refills: 0  What changed:    how much to take   when to take this        CONTINUE taking these medications      Add'l Info   Actemra 162 MG/0.9ML Sosy injection  Inject 0.9 mL (162 mg) under the skin every 7 days.  Generic drug: tocilizumab   Refills: 0     albuterol 108 (90 Base) MCG/ACT inhaler  Inhale 1 puff by mouth every 6 hours as needed for Shortness of Breath or Wheezing.   Refills: 0     anterior blue eye kit  USED AS INSTRUCTED BY YOUR PHYSICIAN (KIT FOR RIGHT EYE)   Quantity: 1 kit  Refills: 0     Azelastine HCl 137 MCG/SPRAY Soln  SPRAY 1 SPRAY INTO EACH NOSTRIL TWICE A DAY   Refills: 0     budesonide-formoterol 160-4.5 MCG/ACT inhaler  Commonly known as: SYMBICORT  Inhale 2 puffs by mouth as needed.   Refills: 0     cetirizine 5 MG tablet  Commonly known as: ZYRTEC  Take 2 tablets (10 mg) by mouth.   Refills:  0     cycloSPORINE 0.05 % ophthalmic emulsion  Commonly known as: RESTASIS  Place 1 drop into both eyes 2 times daily.   Quantity: 2 each  Refills: 0     ergocalciferol 50000 UNIT capsule  Commonly known as: VITAMIN D  TAKE 1 CAPSULE BY MOUTH WEEKLY FOR 8 WEEKS   Refills: 0     fluorometholone 0.1 % ophthalmic suspension  Commonly known as: FML  Place 1 drop into both eyes 4 times daily.   Quantity: 1 bottle  Refills: 0     folic acid 1 MG tablet  Commonly known as: FOLVITE  1 tablet (1 mg) by Oral route.   Refills: 0     lidocaine-prilocaine 2.5-2.5 % cream  Commonly known as: EMLA  APPLY 1 APPLICATION ON THE SKIN AS DIRECTED APPLY 10 GRAMS TO SKIN 30 MINUTES PRIOR TO PROCEDURE.   Refills: 0     methotrexate 250 MG/10ML Soln injection  Commonly known as: RHEUMATREX  INJECT 0.8 ML EVERY 7 DAYS--MULTIDOSE VIALS   Refills: 0     naloxegol oxalate 12.5 MG Tabs tablet  Commonly known as: MOVANTIK  Take 12.5-25 mg by mouth.   Refills: 0     naloxone 4 mg/0.1 mL nasal spray  Commonly known as: NARCAN  1 spray.   Refills: 0     oxyCODONE 15 MG immediate release tablet  Commonly known as: ROXICODONE  Take 1 tablet (15 mg) by mouth every 8 hours as needed for Moderate Pain (Pain Score 4-6) or Severe Pain (Pain Score 7-10) for up to 21 doses.   Quantity: 21 tablet  Refills: 0     Rasuvo 7.5 MG/0.15ML Soaj  Inject 0.15 mL under the skin every 7 days.  Generic drug: Methotrexate (PF)   Refills: 0     sertraline 100 MG tablet  Commonly known as: ZOLOFT  Take 1 tablet (100 mg) by mouth daily.   Refills: 0  Synthroid 88 MCG tablet  Take 1 tablet (88 mcg) by mouth.  Generic drug: levothyroxine   Refills: 0     Theratears 0.25 % Soln  Place 1 drop into both eyes daily.  Generic drug: Carboxymethylcellulose Sodium   Refills: 0        STOP taking these medications    calcium carbonate 500 MG tablet     prednisoLONE acetate 1 % ophthalmic suspension  Commonly known as: PRED FORTE     predniSONE 5 MG tablet  Commonly known as:  DELTASONE     tiotropium 18 MCG inhalation capsule  Commonly known as: SPIRIVA HANDIHALER     tizanidine 2 MG tablet  Commonly known as: ZANAFLEX     Wixela Inhub 250-50 MCG/DOSE inhaler  Generic drug: fluticasone-salmeterol           Where to Get Your Medications      [id number] [place] [address] [id number]   Hours: 24-hours Phone: [phone]   acetaminophen 500 MG tablet   methocarbamol 750 MG tablet   methylPREDNISolone 4 MG tablet   methylPREDNISolone 4 MG tablet   methylPREDNISolone 4 MG tablet   methylPREDNISolone 4 MG tablet   methylPREDNISolone 8 MG tablet   methylPREDNISolone 8 MG tablet   senna 8.6 MG tablet           Hospital Problem List:  Patient Active Problem List   Diagnosis   . Age-related nuclear cataract of both eyes   . Subcutaneous mass of right thumb   . Cataract, unspecified cataract type, unspecified laterality   . Chronic osteomyelitis of pelvic region, unspecified laterality (CMS-HCC)   . Osteomyelitis (CMS-HCC)   . Intractable pain           Reason for Admission to the Hospital / History of Present Illness:     LDalexa Neal a89year oldfemalewithhistory of complex pelvic pain issues, including previous surgery for "[facility]bacterial[facility]omyelitis of the pelvis,"[facility] was discharged from our service on 03/19/22. Came in for outpatient bone biopsy as well as cryoablation and SI joint injectionvia IR today (03/20/22) but was unable to get her pain controlled after the procedure and is now being placed in observation for pain control. Currently patient denies any SI/HI. Denies any fevers, chills, N/V, abdominal pain or dysuria, cp, sob, palpitations, dizziness, ha.     MRI from 02/20/22 showed"Enhancing lesions of the right iliac bone and left sacral ala, which may be related to a chronic infectious process or a neoplastic disease such as lymphoma.  Recommend correlation with prior imaging if available and biopsy if clinically indicated."    Hospital course:   VSS, afebrile, normotensive.   Laboratory work up unremarkable with exception of wbc 16.2 (w/o e/o infection). Plts 87 (with no active bleed).     Hospital Course by Problem:       Impression:  Shelly Greensteinis a89year oldfemalewithhistory of complex pelvic pain issues, including previous surgery for "nonbacterialosteomyelitis of the pelvis[facility] 2/2 lytic lesions. Pain now better controlled and patient desires to go home.    Assessment:  #chronic right pelvic/hip pain2/2 chronic osteomyelitis of the pelvis since 2020? [facility] Underwent bone biopsy and cryoablation and SI joint injectionvia IR. Procedure went well but patient has had  intractable pain since the procedure.  -Admitted to Observation for pain control  -Pain regimen prescribed for home: cont apap TID, oxycodone 10/15 prn for mod/severe pain, start robaxin 750 tid, lidocaine cream prn, medrol dose pack (5 day)    #Enhancing lesions of the right iliac bone and left sacra ala, concerning for infectious process vs neoplastic disease.   -s/p bone biopsy    #RA, followed by outpatient rheumatologist. Patient with LE rashes, taking prednisone daily.      Consultations Obtained During This Hospitalization:  None    Principal Procedure During This Hospitalization:  MRI Pelvis Bone W/WO Contrast    Result Date: 02/27/2022  Enhancing lesions of the right iliac bone and left sacral ala, which may be related to a chronic infectious process or a neoplastic disease such as lymphoma. Recommend correlation with prior imaging if available and biopsy if clinically indicated. PET/CT may also provide additional information. I have personally reviewed the images upon which this report is based and agree with the findings and conclusions  expressed above.    CT Chest W/O Contrast    Result Date: 02/22/2022  Lucent changes in the T5, T9, T10, and T11 vertebral bodies, nonspecific and may be hemangiomas. Sclerotic changes in the T5 vertebral body, more than expected for degenerative change. Recommend MR thoracic spine without and with intravenous contrast for better evaluation. 6 mm pleural-based right lower lobe nodule. Few bilateral pleural-based and nonpleural-based micronodules. Linear scarring/atelectasis in bilateral lower lobes. Peribronchial wall thickening. Scattered mucous plugging. 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 Small pericardial effusion. Chronic findings, as above.     CT Abdomen And Pelvis W/O Contrast    Result Date: 02/21/2022  1.  Multiple sclerotic bone lesions, concerning for metastatic malignancy. Evaluation for neoplastic process is limited by lack of intravenous contrast. No abnormality suspicious for malignancy identified in the abdomen and pelvis at noncontrast CT. END       Microbiology Cultures:  Microbiology Results (last 7 days)     Procedure Component Value - Date/Time    Anaerobic Culture [[id number]] Collected: 03/20/22 1550    Lab Status: Preliminary result Updated: 03/21/22 1456     Description TISSUE HIP,RIGHT     Special Info NONE     Gram Stain --     2+  RED BLOOD CELLS       Gram Stain --     RARE  WBC'S SEEN       Gram Stain NO ORGANISMS SEEN     Culture Result NO GROWTH, CULTURE WILL BE HELD FOR 7 DAYS    Coronavirus Disease 2019 (COVID-19) SCOVS [[id number]] Collected: 03/18/22 1905    Lab Status: Final result Specimen: Swab from Nasal-Pharyngeal Updated: 03/18/22 2033     COVID-19 Source SWAB     Comment: NASOPHARYNX        COVID-19 Result NOT DETECTED     COVID-19 Comment Reference range: Not Detected     Comment: An interpretation of Not Detected cannot exclude the presence of SARS-CoV-2 RNA   concentrations below detection limits. The Xpert Xpress SARS-CoV-2 plus is a   real-time reverse  transcription polymerase chain reaction (rRT-PCR) intended   for the qualitative detection of nucleic acid from the SARS-CoV-2.  This test has been authorized by the Food and Drug Administration (FDA) under an Emergency Use Authorization (EUA) for use by laboratories certified to perform moderate and high complexity testing under the Clinical Laboratory Improvement Amendments   (CLIA).  Tests Outstanding at Discharge Requiring Follow Up:  none    Discharge Condition:  Improved.      Key Physical Exam Findings at Discharge:  No significant physical examination findings at the time of discharge.     03/20/22  2351 03/21/22  0400 03/21/22  0800 03/21/22  1146   BP: (!) 108/53 (!) 106/51 (!) 104/51 (!) 108/53   Pulse: 66 58 63 59   Temp: 99 F (37.2 C) 97.7 F (36.5 C) 97.8 F (36.6 C) 98.3 F (36.8 C)   Resp: 20 20 20 18    SpO2: 94% 95% 96% 98%       General:NAD, AAO/3  Card: normal S1 S2, regular rate & rhythm, no murmurs  Pulm: CTAB, normal respiratory effort, no wheezes  Ext: warm, well perfused, no edema,Right hip pain- chronic. bx site c/d/i with no ecchymosis  Neuro: A&O x3, no focal deficits    Discharge Diet:   Diet Order Regular       Allergies:  Allergies   Allergen Reactions   . Cefaclor Hives     No problems with amoxicillin/clavulanate - last taken October 2019; Other reaction(s): Pruritic rash (disorder); No problems with amoxicillin/clavulanate - last taken October 2019   . Ciprofloxacin Rash   . Diagnostic X-Ray Materials Rash   . Fd&C Blue #2 [place]-Guai Rash   . Gabapentin Nausea and Vomiting   . Hydromorphone Rash     pruritic rash to left arm - no shortness of breath;    Marland Kitchen Ibuprofen Hives and Rash     Aleve;    Marland Kitchen Meloxicam Rash   . Naproxen Rash   . Neomycin-Polymyxin B Gu Rash   . Polyethylene Glycol Hives     Other reaction(s): Pruritic rash (disorder);    Marland Kitchen Pseudoephedrine Rash   . Sulfamethoxazole W-Trimethoprim Hives and Rash     Other reaction(s): Pruritic rash (disorder)    . Guaifenesin Unspecified   . Homeopathic Products Other   . Sulfamethoxazole Other   . Trimethoprim Other   . Iodine Other       Discharge Code Status:  Full code / full care  This code status is not changed from the time of admission.    Advance Directive on File?  No    For appointments requested for after discharge that have not yet been scheduled, refer to the Post Discharge Referrals section of the After Visit Summary.    Discharging 1 Contact Information:   Discharging Physician's Contact Information: [facility] at [phone]        Dennison Nancy, NP  03/21/22          ATTENDING ATTESTATION:  I saw and evaluated the patient with the APP. I provided a substantive portion of the care of this patient.  I have reviewed and verified this documentation which accurately reflects our care.  I have independently performed the assessment and plan and documented it within the note.     >30 minutes of discharge planning and care coordination provided.     Herbie Drape, MD

## 2022-03-21 NOTE — Plan of Care (Signed)
Problem: Promotion of health and safety  Goal: Promotion of Health and Safety  Description: The patient remains safe, receives appropriate treatment and achieves optimal outcomes (physically, psychosocially, and spiritually) within the limitations of the disease process by discharge.  Outcome: Progressing  Flowsheets (Taken 03/21/2022 0304)  Standard of Care/Policy:   Skin   Medical/Surgical   Falls Reduction   Pain  Outcome Evaluation (rationale for progressing/not progressing) every shift: patient is alert and oriented x 4 s/p Right iliac boe biopsy, cryoablation and SI join injection wih pressure dressing on mid back mod drainage refinorced with gauze and paper tape c/d/i, patient's pain controlled with oxycodone PRN, tylenol, and morphine IV PRN, patient voiding spontaneosly, ambulating with walker with 1 person assisance, and using call light for assistance  Patient /Family stated Goal: rest and go home  Individualized Interventions/Recommendations (Discharge Readiness): patient ambulating in hallway with walker and 1 person assistance, pain adequately controlled, vital signs stable, no nausea or vomiting, tolerating diet  Individualized Interventions/Recommendations (Skin/Comfort/Safety/Mobility): skin is c/d/i. pressure dressing on back mod drainage reinforced with gauze and paper tape, encouraged patient to turn every 2 hours and use call light when wanting to get OOB  Individualized Interventions/Recommendations (Knowledge): updated patient on POC

## 2022-03-25 ENCOUNTER — Other Ambulatory Visit: Payer: Self-pay | Admitting: Nurse Practitioner

## 2022-03-25 LAB — LEUKEMIA/LYMPHOMA PANEL, FLOW CYTOMETRY - ~~LOC~~

## 2022-03-27 ENCOUNTER — Telehealth: Payer: Self-pay | Admitting: Orthopaedic Surgery

## 2022-03-27 ENCOUNTER — Encounter: Payer: Self-pay | Admitting: Orthopaedic Surgery

## 2022-03-27 LAB — ANAEROBIC CULTURE
Culture Result: NO GROWTH
Gram Stain: NONE SEEN

## 2022-03-27 LAB — PATHOLOGY TISSUE EXAM - ~~LOC~~

## 2022-03-27 NOTE — Progress Notes (Incomplete)
Elyn Aquas. Learned, M.D.  Assistant Clinical Professor of Orthopaedic Surgery  Orthopaedic Trauma and Hip Preservation  [employer], [place], [place]  [place], [place]    [phone]   [email or web address] .[place].Seidy Labreck  08-10-1946  [id number]      CC: "NonbacterialOsteomyelitis of the pelvis"    HPI: 76 year old female with a history of complex pelvic pain issues, including previous surgery for "nonbacterialosteomyelitis of the pelvis." ***    Past Medical History:   Diagnosis Date   . Cataract    . Chronic osteomyelitis, pelvic region and thigh (CMS-HCC)    . Dry eye    . Hypothyroidism    . RA (rheumatoid arthritis) (CMS-HCC)        Past Surgical History:   Procedure Laterality Date   . CATARACT EXTRACTION Right 09/02/2020   . BONE RESECTION Right 2019    [facility]       No family history on file.    Social History     Socioeconomic History   . Marital status: Married     Spouse name: Not on file   . Number of children: Not on file   . Years of education: Not on file   . Highest education level: Not on file   Occupational History   . Not on file   Tobacco Use   . Smoking status: Never   . Smokeless tobacco: Never   Substance and Sexual Activity   . Alcohol use: Never   . Drug use: Never   . Sexual activity: Not on file   Other Topics Concern   . Not on file   Social History Narrative   . Not on file     Social Determinants of Health     Financial Resource Strain: Not on file   Food Insecurity: Not on file   Transportation Needs: Not on file   Physical Activity: Not on file   Stress: Not on file   Social Connections: Not on file   Intimate Partner Violence: Not on file   Housing Stability: Not on file         Current Outpatient Medications:   .  acetaminophen (TYLENOL) 500 MG tablet, Take 2 tablets (1,000 mg) by mouth every 8 hours as needed for Mild Pain (Pain Score 1-3) for up to 10 days., Disp: 30 tablet, Rfl: 0  .  albuterol 108 (90 Base)  MCG/ACT inhaler, Inhale 1 puff by mouth every 6 hours as needed for Shortness of Breath or Wheezing., Disp: , Rfl:   .  anterior blue eye kit, USED AS INSTRUCTED BY YOUR PHYSICIAN (KIT FOR RIGHT EYE), Disp: 1 kit, Rfl: 0  .  Azelastine HCl 137 MCG/SPRAY SOLN, SPRAY 1 SPRAY INTO EACH NOSTRIL TWICE A DAY, Disp: , Rfl:   .  budesonide-formoterol (SYMBICORT) 160-4.5 MCG/ACT inhaler, Inhale 2 puffs by mouth as needed., Disp: , Rfl:   .  Carboxymethylcellulose Sodium (THERATEARS) 0.25 % SOLN, Place 1 drop into both eyes daily., Disp: , Rfl:   .  cetirizine (ZYRTEC) 5 MG tablet, Take 2 tablets (10 mg) by mouth., Disp: , Rfl:   .  cycloSPORINE (RESTASIS) 0.05 % ophthalmic emulsion, Place 1 drop into both eyes 2 times daily., Disp: 2 each, Rfl: 0  .  ergocalciferol (VITAMIN D) 50000 UNIT capsule, TAKE 1 CAPSULE BY MOUTH WEEKLY FOR 8 WEEKS, Disp: , Rfl:   .  fluorometholone (FML) 0.1 % ophthalmic suspension, Place 1 drop into both eyes 4 times daily., Disp: 1 bottle, Rfl: 0  .  folic acid (FOLVITE) 1 MG tablet, 1 tablet (1 mg) by Oral route., Disp: , Rfl:   .  levothyroxine (SYNTHROID) 88 MCG tablet, Take 1 tablet (88 mcg) by mouth., Disp: , Rfl:   .  lidocaine-prilocaine (EMLA) 2.5-2.5 % cream, APPLY 1 APPLICATION ON THE SKIN AS DIRECTED APPLY 10 GRAMS TO SKIN 30 MINUTES PRIOR TO PROCEDURE., Disp: , Rfl:   .  methocarbamol (ROBAXIN) 750 MG tablet, Take 1 tablet (750 mg) by mouth 4 times daily., Disp: 120 tablet, Rfl: 0  .  methotrexate (RHEUMATREX) 250 MG/10ML SOLN injection, INJECT 0.8 ML EVERY 7 DAYS--MULTIDOSE VIALS, Disp: , Rfl:   .  Methotrexate, PF, (RASUVO) 7.5 MG/0.15ML SOAJ, Inject 0.15 mL under the skin every 7 days., Disp: , Rfl:   .  methylPREDNISolone (MEDROL) 4 MG tablet, Take 1 tablet (4 mg) by mouth every morning (before breakfast) for 5 doses., Disp: 5 tablet, Rfl: 0  .  naloxegol oxalate (MOVANTIK) 12.5 MG TABS tablet, Take 12.5-25 mg by mouth., Disp: , Rfl:   .  naloxone (NARCAN) 4 mg/0.1 mL nasal spray, 1  spray., Disp: , Rfl:   .  oxyCODONE (ROXICODONE) 15 MG immediate release tablet, Take 1 tablet (15 mg) by mouth every 8 hours as needed for Moderate Pain (Pain Score 4-6) or Severe Pain (Pain Score 7-10) for up to 21 doses., Disp: 21 tablet, Rfl: 0  .  senna (SENOKOT) 8.6 MG tablet, Take 2 tablets (17.2 mg) by mouth 2 times daily., Disp: 30 tablet, Rfl: 0  .  sertraline (ZOLOFT) 100 MG tablet, Take 1 tablet (100 mg) by mouth daily., Disp: , Rfl:   .  tocilizumab (ACTEMRA) 162 MG/0.9ML SOSY injection, Inject 0.9 mL (162 mg) under the skin every 7 days., Disp: , Rfl:     Allergies  Allergies   Allergen Reactions   . Cefaclor Hives     No problems with amoxicillin/clavulanate - last taken October 2019; Other reaction(s): Pruritic rash (disorder); No problems with amoxicillin/clavulanate - last taken October 2019   . Ciprofloxacin Rash   . Diagnostic X-Ray Materials Rash   . Fd&C Blue #2 [place]-Guai Rash   . Gabapentin Nausea and Vomiting   . Hydromorphone Rash     pruritic rash to left arm - no shortness of breath;    Marland Kitchen Ibuprofen Hives and Rash     Aleve;    Marland Kitchen Meloxicam Rash   . Naproxen Rash   . Neomycin-Polymyxin B Gu Rash   . Polyethylene Glycol Hives     Other reaction(s): Pruritic rash (disorder);    Marland Kitchen Pseudoephedrine Rash   . Sulfamethoxazole W-Trimethoprim Hives and Rash     Other reaction(s): Pruritic rash (disorder)   . Guaifenesin Unspecified   . Homeopathic Products Other   . Sulfamethoxazole Other   . Trimethoprim Other   . Iodine Other       Review of Systems  13 point review of systems negative unless otherwise noted in the HPI.    PE  Vitals:  There were no vitals taken for this visit.  General  - Normocephalic, atraumatic. No acute distress.   Psych  - A&O x3, normal questions/judgement***  Pulmonary  - Speaks in full sentences, no retractions  Right Lower Extremity:  Skin: Intact, ***  Vascular: DP Pulse ***, PT Pulse ***  Neuro:   - Sensation  T/DP/SP/S/S ***   - Motor exam:  hip flexion      ***/5  hip abduction     ***/5  knee flexion     ***/5  knee extension    ***/5  Dorsiflexion     ***/5  Extensor Hallucis Longus   ***/5  Plantarflexion     ***/5    - ROM:   Hip   ***  Knee   ***  Ankle   ***      Imaging  X-rays of the *** obtained today and interpreted by me show ***    A/P  76 year old female with history of complex pelvic pain issues, including previous surgery for "nonbacterialosteomyelitis of the pelvis."     - {WBS} ***  - ***  - Return to clinic ***.    I have personally reviewed and interpreted the patient's prior diagnostic testing, relevant imaging, and external notes from their other treating physicians in the process of formulating my assessment and plan of care for their current symptoms and pathology.    I have also personally discussed the patient's diagnostic testing, relevant imaging, and my assessment and plan with the patient's relevant external physicians and healthcare providers as needed    Scribe Attestation  The notes I am recording reflect only actions made by and judgments taken by this provider, Dr. Elgie Congo, for whom I am scribing today.  I have performed no independent clinical work.    Armandina Stammer    ____________________________________________________________________    {Double click to enter provider attestation for scribed [id number]}

## 2022-03-27 NOTE — Telephone Encounter (Signed)
Patient calling asking the office receive her medical records and imaging from Dr. Ebony Hail from [facility]    Please Assist, thank you

## 2022-03-30 ENCOUNTER — Ambulatory Visit: Payer: Medicare Other | Attending: Orthopaedic Surgery | Admitting: Orthopaedic Surgery

## 2022-03-30 VITALS — BP 100/53 | HR 68 | Temp 97.5°F | Resp 17

## 2022-03-30 DIAGNOSIS — M533 Sacrococcygeal disorders, not elsewhere classified: Secondary | ICD-10-CM | POA: Insufficient documentation

## 2022-03-30 NOTE — Progress Notes (Signed)
Shelly Neal. Learned, M.D.  Assistant Clinical Professor of Orthopaedic Surgery  Orthopaedic Trauma and Hip Preservation  [employer], [place], [place]  [place], [place]    [phone]   [email or web address]  [birth year]  [id number]      CC: "Nonbacterial Osteomyelitis of the pelvis"    HPI: 76 year old female with a history of complex pelvic pain issues, including previous surgery for "nonbacterial osteomyelitis of the pelvis." The patient presents with her husband today. She complains of continued severe pain, which keeps her up at night. She emphasizes that her pain is severe and affects her ADLs. She states Dr. Ebony Hail at [facility] does not think it is ?CROM (?chronic recurrent osteomyelitis). Takes Tylenol 500 mg q/6hr, Tramadol BID, and CBD BID. Also takes Prednisone 10-15 mg daily x/45month Also takes Methotrexate and Actemra. She reports improvement with Actemra when her CSR was elevated. She was formerly followed by Dr. HWarnell Foresterfor Pain management, but is now followed by Dr. BLuciana Axe     Interval  History:  Since the last visit the patient was able to obtain a right iliac bone lesion biopsy with subsequent cryo ablation at right SI joint injection.  She states her pain has significantly improved from 13 to now 2/10.  She continues to endorse right hip SI pain mainly at night.  No fevers or chills.  She believes that she needs some sort of procedure as she can not deal with the pain at night.  She states since the SI joint injection she has not taking any opioids and is now only on Tylenol.      Past Medical History:   Diagnosis Date   . Cataract    . Chronic osteomyelitis, pelvic region and thigh (CMS-HCC)    . Dry eye    . Hypothyroidism    . RA (rheumatoid arthritis) (CMS-HCC)        Past Surgical History:   Procedure Laterality Date   . CATARACT EXTRACTION Right 09/02/2020   . [facility]      No family  history on file.    Social History     Socioeconomic History   . Marital status: Married     Spouse name: Not on file   . Number of children: Not on file   . Years of education: Not on file   . Highest education level: Not on file   Occupational History   . Not on file   Tobacco Use   . Smoking status: Never   . Smokeless tobacco: Never   Substance and Sexual Activity   . Alcohol use: Never   . Drug use: Never   . Sexual activity: Not on file   Other Topics Concern   . Not on file   Social History Narrative   . Not on file     Social Determinants of Health     Financial Resource Strain: Not on file   Food Insecurity: Not on file   Transportation Needs: Not on file   Physical Activity: Not on file   Stress: Not on file   Social Connections: Not on file   Intimate Partner Violence: Not on file   Housing Stability: Not on file         Current Outpatient Medications:   .  acetaminophen (TYLENOL) 500 MG tablet, Take 2 tablets (1,000 mg) by  mouth every 8 hours as needed for Mild Pain (Pain Score 1-3) for up to 10 days., Disp: 30 tablet, Rfl: 0  .  albuterol 108 (90 Base) MCG/ACT inhaler, Inhale 1 puff by mouth every 6 hours as needed for Shortness of Breath or Wheezing., Disp: , Rfl:   .  anterior blue eye kit, USED AS INSTRUCTED BY YOUR PHYSICIAN (KIT FOR RIGHT EYE), Disp: 1 kit, Rfl: 0  .  Azelastine HCl 137 MCG/SPRAY SOLN, SPRAY 1 SPRAY INTO EACH NOSTRIL TWICE A DAY, Disp: , Rfl:   .  budesonide-formoterol (SYMBICORT) 160-4.5 MCG/ACT inhaler, Inhale 2 puffs by mouth as needed., Disp: , Rfl:   .  Carboxymethylcellulose Sodium (THERATEARS) 0.25 % SOLN, Place 1 drop into both eyes daily., Disp: , Rfl:   .  cetirizine (ZYRTEC) 5 MG tablet, Take 2 tablets (10 mg) by mouth., Disp: , Rfl:   .  cycloSPORINE (RESTASIS) 0.05 % ophthalmic emulsion, Place 1 drop into both eyes 2 times daily., Disp: 2 each, Rfl: 0  .  ergocalciferol (VITAMIN D) 50000 UNIT capsule, TAKE 1 CAPSULE BY MOUTH WEEKLY FOR 8 WEEKS, Disp: , Rfl:   .   fluorometholone (FML) 0.1 % ophthalmic suspension, Place 1 drop into both eyes 4 times daily., Disp: 1 bottle, Rfl: 0  .  folic acid (FOLVITE) 1 MG tablet, 1 tablet (1 mg) by Oral route., Disp: , Rfl:   .  levothyroxine (SYNTHROID) 88 MCG tablet, Take 1 tablet (88 mcg) by mouth., Disp: , Rfl:   .  lidocaine-prilocaine (EMLA) 2.5-2.5 % cream, APPLY 1 APPLICATION ON THE SKIN AS DIRECTED APPLY 10 GRAMS TO SKIN 30 MINUTES PRIOR TO PROCEDURE., Disp: , Rfl:   .  methocarbamol (ROBAXIN) 750 MG tablet, Take 1 tablet (750 mg) by mouth 4 times daily., Disp: 120 tablet, Rfl: 0  .  methotrexate (RHEUMATREX) 250 MG/10ML SOLN injection, INJECT 0.8 ML EVERY 7 DAYS--MULTIDOSE VIALS, Disp: , Rfl:   .  Methotrexate, PF, (RASUVO) 7.5 MG/0.15ML SOAJ, Inject 0.15 mL under the skin every 7 days., Disp: , Rfl:   .  naloxegol oxalate (MOVANTIK) 12.5 MG TABS tablet, Take 12.5-25 mg by mouth., Disp: , Rfl:   .  naloxone (NARCAN) 4 mg/0.1 mL nasal spray, 1 spray., Disp: , Rfl:   .  oxyCODONE (ROXICODONE) 15 MG immediate release tablet, Take 1 tablet (15 mg) by mouth every 8 hours as needed for Moderate Pain (Pain Score 4-6) or Severe Pain (Pain Score 7-10) for up to 21 doses., Disp: 21 tablet, Rfl: 0  .  senna (SENOKOT) 8.6 MG tablet, Take 2 tablets (17.2 mg) by mouth 2 times daily., Disp: 30 tablet, Rfl: 0  .  sertraline (ZOLOFT) 100 MG tablet, Take 1 tablet (100 mg) by mouth daily., Disp: , Rfl:   .  tocilizumab (ACTEMRA) 162 MG/0.9ML SOSY injection, Inject 0.9 mL (162 mg) under the skin every 7 days., Disp: , Rfl:     Allergies  Allergies   Allergen Reactions   . Cefaclor Hives     No problems with amoxicillin/clavulanate - last taken October 2019; Other reaction(s): Pruritic rash (disorder); No problems with amoxicillin/clavulanate - last taken October 2019   . Ciprofloxacin Rash   . Diagnostic X-Ray Materials Rash   . Fd&C Blue #2 [place]-Guai Rash   . Gabapentin Nausea and Vomiting   . Hydromorphone Rash     pruritic rash to left arm -  no shortness of breath;    Marland Kitchen Ibuprofen Hives and Rash  Aleve;    Marland Kitchen Meloxicam Rash   . Naproxen Rash   . Neomycin-Polymyxin B Gu Rash   . Polyethylene Glycol Hives     Other reaction(s): Pruritic rash (disorder);    Marland Kitchen Pseudoephedrine Rash   . Sulfamethoxazole W-Trimethoprim Hives and Rash     Other reaction(s): Pruritic rash (disorder)   . Guaifenesin Unspecified   . Homeopathic Products Other   . Sulfamethoxazole Other   . Trimethoprim Other   . Iodine Other       Review of Systems  13 point review of systems negative unless otherwise noted in the HPI.    PE  Vitals:  BP (!) 100/53   Pulse 68   Temp 97.5 F (36.4 C)   Resp 17   General  - Normocephalic, atraumatic. No acute distress.   Psych  - A&O x3, normal questions/judgement  Pulmonary  - Speaks in full sentences, no retractions  Right Lower Extremity:  Skin: Intact, healed incision  Hip   Limited by pain  Knee   normal  Ankle   normal      Imaging  MRI of the Pelvis obtained 02/20/22 and interpreted by me show signal changes in anterior/posterior right ilium, left sacrum  CT of the Chest/Abdomen/Pelvis WO contrast obtained 02/20/22 show no obvious primary lesion.    7/7 A. Hip, Right Iliac Bone Mass, Biopsy:  -            Benign bone and marrow elements with loose fibrosis (see comment).  -            No evidence of acute osteomyelitis.  -            No evidence of malignancy.    A/P  76 year old female with a history of complex pelvic pain issues, including previous surgery for "nonbacterial osteomyelitis of the pelvis." The etiology of the patient's pain remains unclear. Patient reported that Dr. Ebony Hail at [facility] "removed a lot of bone," however imaging does not corroborate extensive bone loss. Imaging shows some abnormality of right pelvic bone, but a definitive diagnosis cannot be determined. I again had a lengthy discussion with the patient and her husband regarding the possible etiology of her pain and treatment options.     She is now s/p right  iliac bone mass biopsy, cryoablation and SI joint CSI with significant improvement.  Pathology with benign bone and marrow elements and no evidence acute osteomyelitis.  We discussed extensively with her that her aspiration results are negative for acute infection or malignancy.  Discussed with her that prior to any discussion of surgical intervention involving the SI joint, we would do a repeat SI joint injection to ensure her symptoms are relieved.  At this time the patient is not interested in any further SI joint injection or SI joint surgical intervention.    - WBAT BLE  - follow-up as needed    I have personally reviewed and interpreted the patient's prior diagnostic testing, relevant imaging, and external notes from their other treating physicians in the process of formulating my assessment and plan of care for their current symptoms and pathology.    I have also personally discussed the patient's diagnostic testing, relevant imaging, and my assessment and plan with the patient's relevant external physicians and healthcare providers as needed  ____________________________________________________________________    Provider Attestation for Scribed Note    As the attending provider, I agree with the scribed content.  Any changes or edits are noted in  the text above.     [place]

## 2022-03-31 LAB — CYTOLOGY FNA - ~~LOC~~

## 2022-03-31 NOTE — Telephone Encounter (Signed)
No outside medical records were received. Patient was seen in clinic.

## 2022-04-08 ENCOUNTER — Encounter: Payer: Self-pay | Admitting: Orthopaedic Surgery

## 2022-04-08 DIAGNOSIS — M533 Sacrococcygeal disorders, not elsewhere classified: Secondary | ICD-10-CM | POA: Insufficient documentation

## 2022-10-19 ENCOUNTER — Other Ambulatory Visit: Payer: Self-pay | Admitting: Nurse Practitioner

## 2023-01-07 ENCOUNTER — Other Ambulatory Visit: Payer: Self-pay | Admitting: Nurse Practitioner

## 2023-03-22 IMAGING — CR DG FOOT COMPLETE 3+V*R*
3 series · 3 of 3 positions shown · non-contrast
Comparison: None.

CLINICAL DATA: Seropositive rheumatoid arthritis

EXAM:
RIGHT FOOT COMPLETE - 3+ VIEW; LEFT FOOT - COMPLETE 3+ VIEW

[AP (1 of 3)]
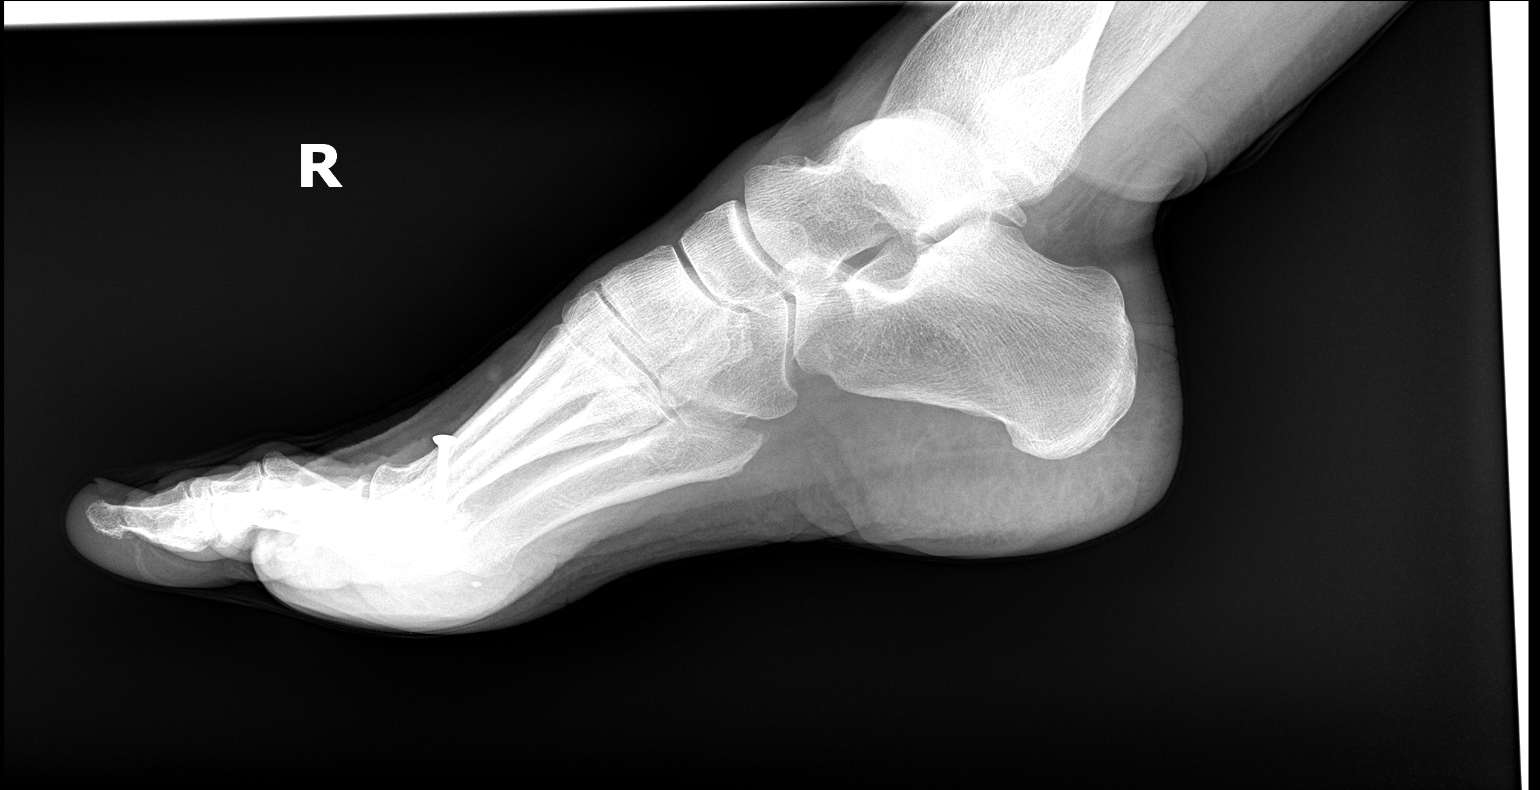

[AP (2 of 3)]
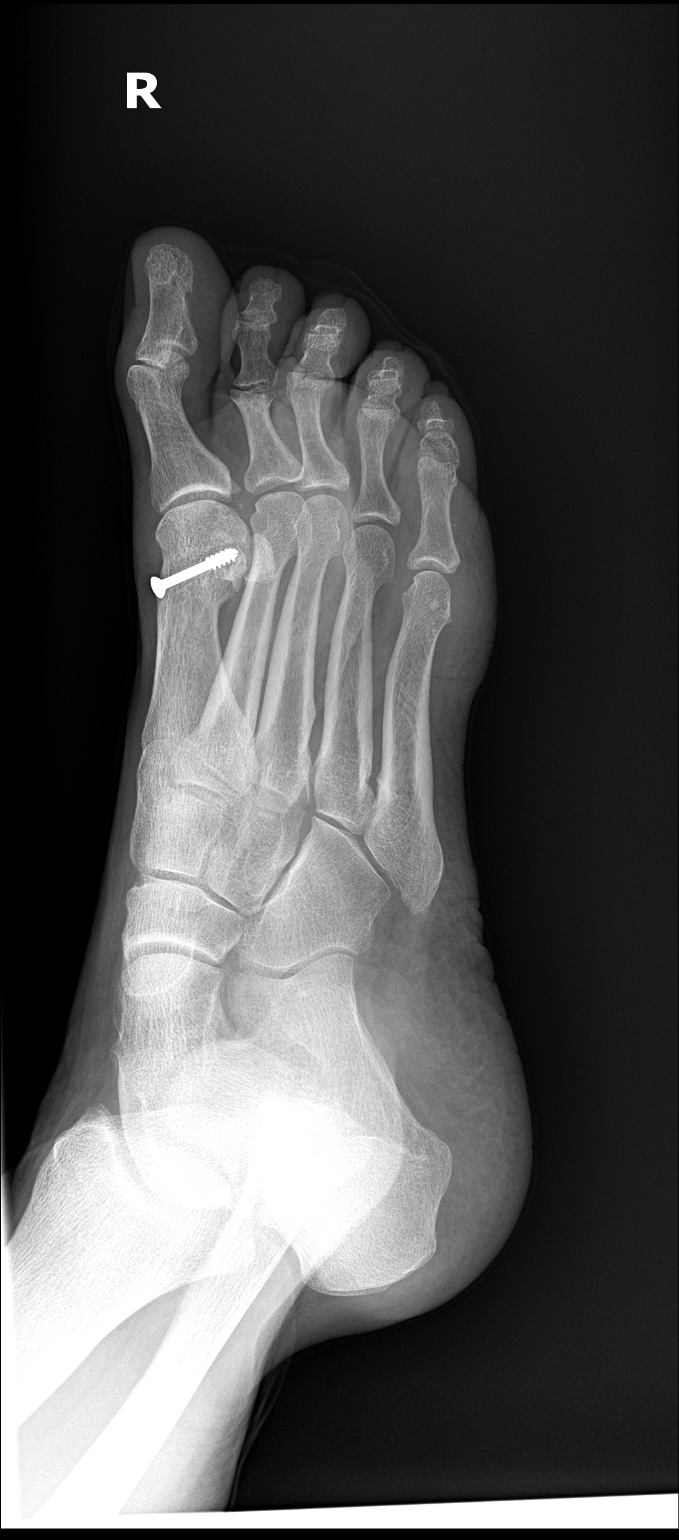

[AP (3 of 3)]
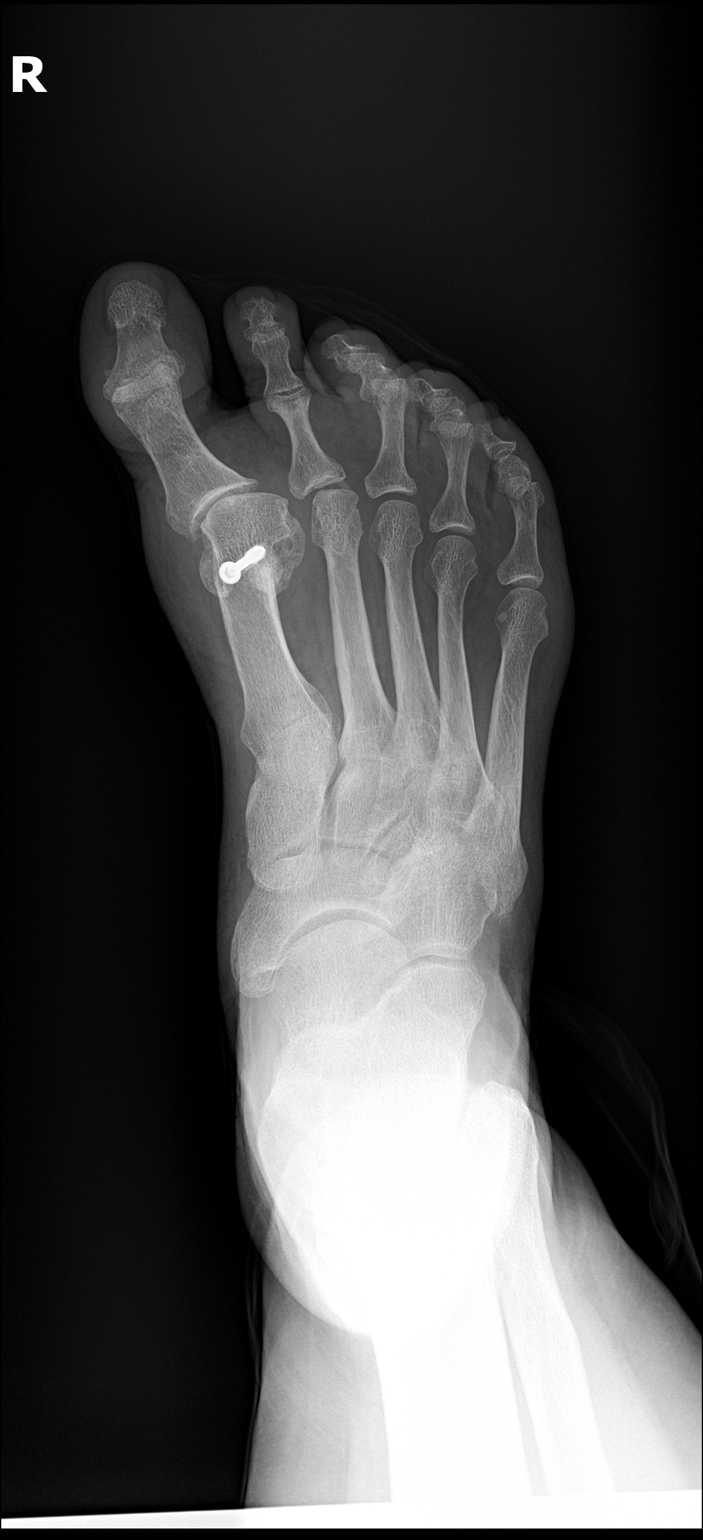

[3 of 3 positions shown; findings below may reference images not displayed]

FINDINGS: Bones: No acute fracture or dislocation. Generalized osteopenia. No
periostitis. Tiny left plantar calcaneal spur.

Joints: Right hallux varus with postsurgical changes of the first
metatarsal. No erosive changes. Moderate osteoarthritis of the
first right MTP joint with medial subluxation. Mild osteoarthritis
of the second right MTP joint. Remainder of the joint spaces are
maintained.

Soft tissue: No soft tissue abnormality. No radiopaque foreign body.
No chondrocalcinosis.
IMPRESSION: Right hallux varus with postsurgical changes of the first
metatarsal.

Moderate osteoarthritis of the first right MTP joint with medial
subluxation.

## 2023-04-21 ENCOUNTER — Telehealth: Payer: Self-pay | Admitting: Orthopaedic Surgery

## 2023-04-21 NOTE — Telephone Encounter (Signed)
Patient is requesting to schedule an appointment with Dr. Hyman Hopes due to a bone disorder called nonbacterial osteomyelitis, patient states she is being referred by Dr. Gershon Mussel from [place].     Please assist, thank you

## 2023-04-21 NOTE — Telephone Encounter (Signed)
Dialed [phone]. I called and spoke to pt and scheduled her for our soonest np appt. 09/25. Pt is aware that should a spot open up or the Dr. Request to see her sooner we will call her. Pt had no further questions.

## 2023-06-09 ENCOUNTER — Encounter: Payer: Self-pay | Admitting: Orthopaedic Surgery

## 2023-06-09 ENCOUNTER — Other Ambulatory Visit: Payer: Self-pay

## 2023-06-09 ENCOUNTER — Ambulatory Visit (HOSPITAL_BASED_OUTPATIENT_CLINIC_OR_DEPARTMENT_OTHER): Admit: 2023-06-09 | Discharge: 2023-06-09 | Disposition: A | Discharge status: Home Health | Payer: Medicare Other

## 2023-06-09 ENCOUNTER — Ambulatory Visit: Payer: Medicare Other | Attending: Orthopaedic Surgery | Admitting: Orthopaedic Surgery

## 2023-06-09 VITALS — BP 134/81 | HR 65 | Temp 97.6°F | Resp 17 | Wt 172.2 lb

## 2023-06-09 DIAGNOSIS — M86659 Other chronic osteomyelitis, unspecified thigh: Secondary | ICD-10-CM | POA: Insufficient documentation

## 2023-06-09 DIAGNOSIS — M1611 Unilateral primary osteoarthritis, right hip: Secondary | ICD-10-CM

## 2023-06-09 DIAGNOSIS — M533 Sacrococcygeal disorders, not elsewhere classified: Secondary | ICD-10-CM | POA: Insufficient documentation

## 2023-06-09 DIAGNOSIS — Z6825 Body mass index (BMI) 25.0-25.9, adult: Secondary | ICD-10-CM | POA: Insufficient documentation

## 2023-06-09 DIAGNOSIS — D472 Monoclonal gammopathy: Secondary | ICD-10-CM | POA: Insufficient documentation

## 2023-06-09 DIAGNOSIS — M899 Disorder of bone, unspecified: Secondary | ICD-10-CM | POA: Insufficient documentation

## 2023-06-09 DIAGNOSIS — R102 Pelvic and perineal pain: Secondary | ICD-10-CM | POA: Insufficient documentation

## 2023-06-09 DIAGNOSIS — M069 Rheumatoid arthritis, unspecified: Secondary | ICD-10-CM | POA: Insufficient documentation

## 2023-06-09 NOTE — Progress Notes (Signed)
New Patient Consultation Office Visit    Referring Provider: self referred    Chief Complaint: Dr. Gershon Mussel     History of Present Illness: Shelly Neal is a 77 year old female who presents today as a consultation for evaluation of right hip chronic osteomyelitis. Patient has a history of rheumatoid arthritis and MGUS. She has had two prior surgeries: 1, R acetabulum and ilium curettage on 02/16/2018 and 2, right posterior ilium curettage and grafting 02/02/2023. Pathology from a biopsy done in 11/09/2017 revealed chronic inflammation consistent with osteomyelitis. Biopsies from her surgeries revealed atrophic marrow and marrow fibrosis. She has never been placed on antibiotic therapy before. Her OR cultures have never grown any organisms. Patent denies any current hip pain, but does endorse pain in her hands and knees, consistent with her baseline.     The patient notes there have not been any recent major changes in the patient's overall health. Additionally, the patient denies noticing any new lumps/bumps, fever/chills, night sweats, unexpected weight loss, change in appetite, nausea/vomiting, new/changed numbness/tingling, or weakness.     Past Medical History:  Past Medical History:   Diagnosis Date    Cataract     Chronic osteomyelitis, pelvic region and thigh (CMS-HCC)     Dry eye     Hypothyroidism     RA (rheumatoid arthritis) (CMS-HCC)        Past Surgical History:  Past Surgical History:   Procedure Laterality Date    CATARACT EXTRACTION Right 09/02/2020    BONE RESECTION Right 2019    [facility]       Family History:  Family History   Problem Relation Name Age of Onset    Heart Disease Mother      Cancer Father      No Known Problems Daughter      No Known Problems Son      Other Brother         Social History:  Social History     Socioeconomic History    Marital status: Married   Tobacco Use    Smoking status: Never    Smokeless tobacco: Never   Substance and Sexual Activity    Alcohol use: Never    Drug  use: Never     Social Determinants of Health     Financial Resource Strain: Low Risk  (01/18/2023)    Received from [facility]    Financial Resource Strain     In the past 12 months has the electric, gas, oil, or water company threatened to shut off services in your home?: No   Food Insecurity: No Food Insecurity (01/18/2023)    Received from [facility]    Food Insecurity     Within the past 12 months, you worried that your food would run out before you got the money to buy more.: Never true     Within the past 12 months, the food you bought just didn't last and you didn't have money to get more.: Never true   Transportation Needs: No Transportation Needs (01/18/2023)    Received from [facility]    Transportation Needs     In the past 12 months, has lack of reliable transportation kept you from medical appointments, meetings, work or from getting things needed for daily living? : No   Social Connections: Socially Integrated (01/18/2023)    Received from [facility]    Social Connections     How often do you feel lonely  or isolated from those around you?: Never   Intimate Partner Violence: Low Risk  (01/18/2023)    Received from [facility]    Interpersonal Violence     In the past 12 months, have you been physically or emotionally hurt or felt threatened by a current or former spouse/partner, a caregiver, or someone else you know? : No   Housing Stability: Low Risk  (01/18/2023)    Received from [facility]    Housing Stability     What is your housing situation today? : I have housing     Are you worried about losing your housing? : No       Allergies:  Allergies   Allergen Reactions    Cefaclor Hives     No problems with amoxicillin/clavulanate - last taken October 2019; Other reaction(s): Pruritic rash (disorder); No problems with amoxicillin/clavulanate - last taken October 2019    Ciprofloxacin Rash    [employer]  Rash    Fd&C Blue #2 [place] Rash    Gabapentin Nausea and Vomiting    Hydromorphone Rash     pruritic rash to left arm - no shortness of breath;     Ibuprofen Hives and Rash     Aleve;     Meloxicam Rash    Naproxen Rash    Neomycin-Polymyxin B Gu Rash    Polyethylene Glycol Hives     Other reaction(s): Pruritic rash (disorder);     Pseudoephedrine Rash    Sulfamethoxazole W-Trimethoprim Hives and Rash     Other reaction(s): Pruritic rash (disorder)    Guaifenesin Unspecified    Homeopathic Products Other    Sulfamethoxazole Other    Trimethoprim Other    Iodine Other       Medications:    Current Outpatient Medications:     acetaminophen (TYLENOL) 500 MG tablet, Take 2 tablets (1,000 mg) by mouth every 8 hours as needed for Mild Pain (Pain Score 1-3) for up to 10 days., Disp: 30 tablet, Rfl: 0    albuterol 108 (90 Base) MCG/ACT inhaler, Inhale 1 puff by mouth every 6 hours as needed for Shortness of Breath or Wheezing., Disp: , Rfl:     anterior blue eye kit, USED AS INSTRUCTED BY YOUR PHYSICIAN (KIT FOR RIGHT EYE), Disp: 1 kit, Rfl: 0    Azelastine HCl 137 MCG/SPRAY SOLN, SPRAY 1 SPRAY INTO EACH NOSTRIL TWICE A DAY, Disp: , Rfl:     budesonide-formoterol (SYMBICORT) 160-4.5 MCG/ACT inhaler, Inhale 2 puffs by mouth as needed., Disp: , Rfl:     Carboxymethylcellulose Sodium (THERATEARS) 0.25 % SOLN, Place 1 drop into both eyes daily., Disp: , Rfl:     cetirizine (ZYRTEC) 5 MG tablet, Take 2 tablets (10 mg) by mouth., Disp: , Rfl:     cycloSPORINE (RESTASIS) 0.05 % ophthalmic emulsion, Place 1 drop into both eyes 2 times daily., Disp: 2 each, Rfl: 0    ergocalciferol (VITAMIN D) 50000 UNIT capsule, TAKE 1 CAPSULE BY MOUTH WEEKLY FOR 8 WEEKS, Disp: , Rfl:     fluorometholone (FML) 0.1 % ophthalmic suspension, Place 1 drop into both eyes 4 times daily., Disp: 1 bottle, Rfl: 0    folic acid (FOLVITE) 1 MG tablet, 1 tablet (1 mg) by Oral route., Disp: , Rfl:     levothyroxine (SYNTHROID) 88 MCG tablet, Take 1  tablet (88 mcg) by mouth., Disp: , Rfl:     lidocaine-prilocaine (EMLA) 2.5-2.5 % cream, APPLY 1 APPLICATION ON THE  SKIN AS DIRECTED APPLY 10 GRAMS TO SKIN 30 MINUTES PRIOR TO PROCEDURE., Disp: , Rfl:     methocarbamol (ROBAXIN) 750 MG tablet, Take 1 tablet (750 mg) by mouth 4 times daily., Disp: 120 tablet, Rfl: 0    methotrexate (RHEUMATREX) 250 MG/10ML SOLN injection, INJECT 0.8 ML EVERY 7 DAYS--MULTIDOSE VIALS, Disp: , Rfl:     Methotrexate, PF, (RASUVO) 7.5 MG/0.15ML SOAJ, Inject 0.15 mL under the skin every 7 days., Disp: , Rfl:     naloxegol oxalate (MOVANTIK) 12.5 MG TABS tablet, Take 12.5-25 mg by mouth., Disp: , Rfl:     oxyCODONE (ROXICODONE) 15 MG immediate release tablet, Take 1 tablet (15 mg) by mouth every 8 hours as needed for Moderate Pain (Pain Score 4-6) or Severe Pain (Pain Score 7-10) for up to 21 doses., Disp: 21 tablet, Rfl: 0    senna (SENOKOT) 8.6 MG tablet, Take 2 tablets (17.2 mg) by mouth 2 times daily., Disp: 30 tablet, Rfl: 0    sertraline (ZOLOFT) 100 MG tablet, Take 1 tablet (100 mg) by mouth daily., Disp: , Rfl:     tocilizumab (ACTEMRA) 162 MG/0.9ML SOSY injection, Inject 0.9 mL (162 mg) under the skin every 7 days., Disp: , Rfl:     Review of Systems (positive responses in bold, otherwise reviewed and negative):  Constitutional: weight loss, weight gain, fatigue, other:  Integumentary: rashes, sores, other:  Eyes: visual difference, eye irritation, other:  Ears, Nose, Mouth, Throat: sore throat, difficulty swallowing, ear aches, other:  GI: abdominal pain, nausea, vomiting, jaundice, other:  GU: painful urination, bloody urine, nocturia, other:  Resp: chronic cough, shortness of breath, other:  CV: chest pain, palpitations, other:  Musculoskeletal: per HPI.  Neuro: numbness, weakness, other:  Psych: negative.  Endo: negative.  Heme/Lymphatic: negative for: abnormal bleeding, abnormal bruising, swollen nodes.  Allergy/Immun: immunocompromised - no    Physical Exam:   06/09/23  0918    BP: 134/81   Pulse: 65   Temp: 97.6 F (36.4 C)   Resp: 17     General: NAD, A&Ox3  Head: atraumatic  Neck: supple, full ROM  Cardiovascular: regular rate and rhythm  Respiratory: unlabored breathing  Abdomen: soft, nontender  Lymphatic: there is not peripheral edema; no lymphadenopathy  Vascular: extremities warm and well perfused, cap refill <2 seconds, 2+ DP/PT pulses  Skin: rash over lower back  Psych: appropriate affect, normal judgement  Neuro: grossly intact.  See Ortho Exam for details of affected exremities    Ortho Exam:  Gait: normal  Extremity: RLE  - Mass/Nodule: no, well healed surgical incisions  - Tenderness: no  - Deformity: no  - Erythema: no  - ROM: Hip 90 degrees flexion  - Motor Exam: good strength in Hip extension/flexion, Knee extension/flexion  - Sensory Exam: intact to light touch in tibial, sural, saphenous, deep peroneal and superficial peroneal nerve distributions    Imaging:  X-ray pelvis 06/09/2023: s/p prior curettage of right peri acetabulum and ilium. No new lesions identified.    MRI R Hip w/o contrast 12/02/2022: Large right posterior Sacro-iliac lesion which is hyperintense on T2 and hypointense on T1. Prior curettage of the right peri-acetabulum and ilium. Bony edema throughout right hemipelvis.     Pathology:  02/02/2023 RIGHT POSTERIOR ILIUM LESION:  - Bone and bone marrow necrosis and marrow fibrosis, consistent with infarct    02/16/2018 BONE, RIGHT PERIACETABULAR LESION:  - Fragments of non-neoplastic bone and scant, atrophic marrow  - No malignancy identified  - No  active inflammatory lesion identified    11/09/2017 CT guided R hip biopsy supra-acetabular iliac bone:   A. BONE, RIGHT ANTERIOR SUPRA-ACETABULAR ILIAC, BIOPSY:  - Medullary bone with acute and chronic inflammation and focal remodeling (See comment)  COMMENT: The histologic features are morphologically consistent with an osteomyelitic process. Acute inflammation is a new finding compared to previous biopsy  ([id number]), which was reviewed for comparison.    08/16/2017 CT guided biopsy:     BONE, RIGHT ANTERIOR SUPRAACETABULAR ILIAC, BIOPSY:  - Trabecular bone with serous atrophy, marrow edema, and hypocellularity of  hematopoietic precursor elements  COMMENT: The histologic findings are essentially identical to those observed in the  previous biopsy (review of outside slides, [facility] [phone]), which were reviewed for  comparison. No neoplasm is identified     I have personally reviewed and interpreted the above imaging and available reports and agree with the findings as reported, except as noted above.    Assessment/Plan:    ICD-10-CM ICD-9-CM   1. Chronic osteomyelitis of pelvic region, unspecified laterality (CMS-HCC)  M86.659 730.15   2. Pain in pelvis  R10.2 IMO0002   3. Sacroiliac joint dysfunction of right side  M53.3 724.6   4. Lytic lesion of bone on x-ray  M89.9 733.90   5. Rheumatoid arthritis involving multiple sites, unspecified whether rheumatoid factor present (CMS-HCC)  M06.9 714.0   6. MGUS (monoclonal gammopathy of unknown significance)  D47.2 67.10     77 year old female with PMH rheumatoid arthritis and MGUS s/p R acetabulum and ilium curettage on 02/16/2018 and right posterior ilium curettage and grafting 02/02/2023. We discussed that she currently has no evidence of recurrence of chronic non bacterial osteomyelitis. We discussed that routine surveillance is usually not indicated for osteomyelitis; however, should she develop similar pain in the future, she should return to clinic for further evaluation.    We have also placed a referral for her to see a rheumatologist at [facility], which was closer to her home.    At this point, we will proceed with:  - RTC PRN  - referral to [facility] rheumatology    All of the patients questions were answered and any additional concerns were addressed.      The patient was instructed to follow up PRN.    The patient was instructed to call our office sooner should any new or  worsening or otherwise concerning symptoms develop to determine if sooner follow up should be arranged.    45 minutes were personally spent on this patient's overall care (exclusive of trainee teaching and/or trainee time spent with patient outside of my presence) , including time spent preparing for today's visit (eg, reviewing OSH records and pertinent tests), obtaining and/or reviewing patient's history, performing a medically necessary and appropriate physical examination/evaluation, counseling and educating the patient and/or family/caregiver, ordering appropriate medications, tests or procedures, referring and/or communicating with other healthcare professionals, documenting clinical information in the EHR, independently interpreting results, and care coordination.    Please note, there may be grammatical or other errors in transcription.  This note was created, at least in part, using voice recognition software.      ----------------------------  A physician assistant (PA) and/or resident physician/clinical fellow/medical student participated in the care of this patient.  The PA/resident/fellow/medical student may have contributed to the patient's documentation; however, I attest that I personally evaluated and examined the patient and performed the substantive portion of the visit.  Furthermore, I personally acquired the information and/or confirmed the history  as documented in the note, and I also personally performed and/or reviewed the documented physical examination to confirm its accuracy.   I agree with the medical decision making and the assessment and plan as documented.  My additions and/or revisions are included in the medical record.    Consuella Lose, MD  Chief, Orthopaedic Oncology   Associate Residency Program Director  Associate Clinical Professor  Department of Orthopaedic Surgery  [place] [facility] [place] [place] [facility]  [phone] (office)  [phone] (fax)

## 2023-06-10 ENCOUNTER — Telehealth: Payer: Self-pay | Admitting: Rheumatology

## 2023-06-10 NOTE — Telephone Encounter (Addendum)
pt requesting sooner appointment  due to experiencing significant pain. She declined the next available appointment on February 24 of next year, as she feels it is too far out. Additionally, she has specified that she would like to see Dr. Maeola Sarah only.please assist.thank you      Reason for sooner appointment:  Patient has a medical concern (not triage related) and needs a sooner appointment     Offered to schedule tentative appointment with Provider: dr.cheng michael at Date/Time: 02/24. Patient declined.    Were other locations offered? yes    Please assist.

## 2023-06-10 NOTE — Telephone Encounter (Signed)
Offered sooner consultation with other     Rheumatoid arthritis / also history of bacterial myositis   Pain 7/10  Hands, forearms, knees, ankles, legs    Actemra & mtx affecting liver and both dosages have been reduced.     Pt asking for sooner consultation. nk

## 2023-06-11 ENCOUNTER — Encounter: Payer: Self-pay | Admitting: Orthopaedic Surgery

## 2023-06-14 ENCOUNTER — Telehealth: Payer: Self-pay | Admitting: Rheumatology

## 2023-06-14 NOTE — Telephone Encounter (Signed)
Left detailed msg. Pt should call back to schedule next available consult with Dr. Maeola Sarah. Also, pt to obtain any pertinent records and have them forwarded to fax #[phone]. nk

## 2023-06-14 NOTE — Telephone Encounter (Signed)
Patient requesting sooner appointment.     Per patient referring [facility] Dr Hyman Hopes is referring her to Dr. Sena Hitch and only wants to schedule with with Dr. Sena Hitch.     Stat Referral [id number]   Dx: Pt with RA, requesting 2nd opinion, new Rheum closer to home    Reason for sooner appointment:  NP appointment is too far out    Offered to schedule tentative appointment with Provider: Dr Sena Hitch at Date/Time: 11/08/23 at 1 pm. Patient declined.    Were other locations offered? yes    Please assist.

## 2023-11-08 ENCOUNTER — Ambulatory Visit: Payer: Medicare Other | Admitting: Rheumatology

## 2024-03-16 ENCOUNTER — Telehealth: Payer: Self-pay | Admitting: Orthopaedic Surgery

## 2024-03-16 NOTE — Telephone Encounter (Addendum)
 Patient is requsting if office can requesting copy of MRI of right hip and report from [facility] for appointment on  03/21/24. Patient had MRI done on 03/13/24.

## 2024-03-21 ENCOUNTER — Ambulatory Visit: Admitting: Orthopaedic Surgery

## 2024-03-22 NOTE — Telephone Encounter (Signed)
 Patient did not present to clinic on 03/21/24
